# Patient Record
Sex: Female | Born: 1950 | Race: Black or African American | Hispanic: No | Smoking: Former smoker
Health system: Southern US, Community
[De-identification: ages and names within clinical notes are randomized; demographics above are authoritative.]

## PROBLEM LIST (undated history)

## (undated) DIAGNOSIS — N183 Chronic kidney disease, stage 3 unspecified: Secondary | ICD-10-CM

## (undated) DIAGNOSIS — I10 Essential (primary) hypertension: Secondary | ICD-10-CM

## (undated) DIAGNOSIS — E785 Hyperlipidemia, unspecified: Secondary | ICD-10-CM

## (undated) DIAGNOSIS — Z8659 Personal history of other mental and behavioral disorders: Secondary | ICD-10-CM

## (undated) HISTORY — DX: Essential (primary) hypertension: I10

## (undated) HISTORY — DX: Chronic kidney disease, stage 3 (moderate): N18.3

## (undated) HISTORY — DX: Personal history of other mental and behavioral disorders: Z86.59

## (undated) HISTORY — DX: Chronic kidney disease, stage 3 unspecified: N18.30

## (undated) HISTORY — DX: Hyperlipidemia, unspecified: E78.5

---

## 2001-06-15 ENCOUNTER — Inpatient Hospital Stay (HOSPITAL_COMMUNITY): Admission: EM | Admit: 2001-06-15 | Discharge: 2001-06-17 | Payer: Self-pay | Admitting: Emergency Medicine

## 2001-06-15 ENCOUNTER — Encounter: Payer: Self-pay | Admitting: Emergency Medicine

## 2003-06-14 ENCOUNTER — Inpatient Hospital Stay (HOSPITAL_COMMUNITY): Admission: EM | Admit: 2003-06-14 | Discharge: 2003-06-18 | Payer: Self-pay | Admitting: Emergency Medicine

## 2003-06-16 ENCOUNTER — Encounter (INDEPENDENT_AMBULATORY_CARE_PROVIDER_SITE_OTHER): Payer: Self-pay | Admitting: Specialist

## 2003-06-24 ENCOUNTER — Encounter: Admission: RE | Admit: 2003-06-24 | Discharge: 2003-06-24 | Payer: Self-pay | Admitting: Internal Medicine

## 2003-06-25 ENCOUNTER — Encounter: Admission: RE | Admit: 2003-06-25 | Discharge: 2003-06-25 | Payer: Self-pay | Admitting: Internal Medicine

## 2003-07-27 ENCOUNTER — Encounter: Admission: RE | Admit: 2003-07-27 | Discharge: 2003-07-27 | Payer: Self-pay | Admitting: Internal Medicine

## 2003-10-30 DIAGNOSIS — E1122 Type 2 diabetes mellitus with diabetic chronic kidney disease: Secondary | ICD-10-CM | POA: Insufficient documentation

## 2003-10-30 DIAGNOSIS — N183 Chronic kidney disease, stage 3 (moderate): Secondary | ICD-10-CM

## 2003-12-02 ENCOUNTER — Emergency Department (HOSPITAL_COMMUNITY): Admission: EM | Admit: 2003-12-02 | Discharge: 2003-12-02 | Payer: Self-pay | Admitting: Emergency Medicine

## 2004-12-14 ENCOUNTER — Inpatient Hospital Stay (HOSPITAL_COMMUNITY): Admission: EM | Admit: 2004-12-14 | Discharge: 2004-12-17 | Payer: Self-pay | Admitting: Emergency Medicine

## 2005-01-16 ENCOUNTER — Ambulatory Visit: Payer: Self-pay | Admitting: Family Medicine

## 2005-01-18 ENCOUNTER — Ambulatory Visit: Payer: Self-pay | Admitting: *Deleted

## 2005-01-30 ENCOUNTER — Ambulatory Visit: Payer: Self-pay | Admitting: Family Medicine

## 2005-02-13 ENCOUNTER — Ambulatory Visit: Payer: Self-pay | Admitting: Family Medicine

## 2005-02-26 ENCOUNTER — Ambulatory Visit: Payer: Self-pay | Admitting: Family Medicine

## 2005-03-19 ENCOUNTER — Ambulatory Visit: Payer: Self-pay | Admitting: Family Medicine

## 2005-09-03 ENCOUNTER — Ambulatory Visit: Payer: Self-pay | Admitting: Family Medicine

## 2005-09-13 ENCOUNTER — Encounter (HOSPITAL_COMMUNITY): Admission: RE | Admit: 2005-09-13 | Discharge: 2005-12-12 | Payer: Self-pay | Admitting: Family Medicine

## 2005-10-04 ENCOUNTER — Ambulatory Visit: Payer: Self-pay | Admitting: Internal Medicine

## 2006-02-04 ENCOUNTER — Ambulatory Visit: Payer: Self-pay | Admitting: Family Medicine

## 2006-02-20 ENCOUNTER — Ambulatory Visit (HOSPITAL_COMMUNITY): Admission: RE | Admit: 2006-02-20 | Discharge: 2006-02-20 | Payer: Self-pay | Admitting: Family Medicine

## 2006-03-04 ENCOUNTER — Encounter (INDEPENDENT_AMBULATORY_CARE_PROVIDER_SITE_OTHER): Payer: Self-pay | Admitting: *Deleted

## 2006-03-05 ENCOUNTER — Encounter (INDEPENDENT_AMBULATORY_CARE_PROVIDER_SITE_OTHER): Payer: Self-pay | Admitting: Family Medicine

## 2006-03-05 ENCOUNTER — Ambulatory Visit: Payer: Self-pay | Admitting: Family Medicine

## 2006-03-07 LAB — CONVERTED CEMR LAB: Pap Smear: NORMAL

## 2006-04-03 ENCOUNTER — Ambulatory Visit: Payer: Self-pay | Admitting: Family Medicine

## 2006-04-23 ENCOUNTER — Ambulatory Visit (HOSPITAL_COMMUNITY): Admission: RE | Admit: 2006-04-23 | Discharge: 2006-04-23 | Payer: Self-pay | Admitting: Surgery

## 2006-05-08 ENCOUNTER — Ambulatory Visit: Payer: Self-pay | Admitting: Family Medicine

## 2006-05-30 ENCOUNTER — Ambulatory Visit: Payer: Self-pay | Admitting: Family Medicine

## 2006-07-29 LAB — CONVERTED CEMR LAB: Pap Smear: NORMAL

## 2006-08-22 ENCOUNTER — Ambulatory Visit: Payer: Self-pay | Admitting: Family Medicine

## 2006-10-17 ENCOUNTER — Ambulatory Visit: Payer: Self-pay | Admitting: Internal Medicine

## 2006-11-15 ENCOUNTER — Ambulatory Visit: Payer: Self-pay | Admitting: Internal Medicine

## 2006-11-21 ENCOUNTER — Ambulatory Visit (HOSPITAL_COMMUNITY): Admission: RE | Admit: 2006-11-21 | Discharge: 2006-11-21 | Payer: Self-pay | Admitting: Internal Medicine

## 2006-12-24 ENCOUNTER — Ambulatory Visit: Payer: Self-pay | Admitting: Internal Medicine

## 2007-01-28 ENCOUNTER — Ambulatory Visit: Payer: Self-pay | Admitting: Internal Medicine

## 2007-05-06 ENCOUNTER — Encounter (INDEPENDENT_AMBULATORY_CARE_PROVIDER_SITE_OTHER): Payer: Self-pay | Admitting: *Deleted

## 2007-05-06 ENCOUNTER — Ambulatory Visit: Payer: Self-pay | Admitting: Internal Medicine

## 2007-07-16 ENCOUNTER — Encounter (INDEPENDENT_AMBULATORY_CARE_PROVIDER_SITE_OTHER): Payer: Self-pay | Admitting: *Deleted

## 2007-07-31 ENCOUNTER — Encounter (INDEPENDENT_AMBULATORY_CARE_PROVIDER_SITE_OTHER): Payer: Self-pay | Admitting: *Deleted

## 2007-07-31 DIAGNOSIS — I10 Essential (primary) hypertension: Secondary | ICD-10-CM | POA: Insufficient documentation

## 2007-07-31 DIAGNOSIS — J309 Allergic rhinitis, unspecified: Secondary | ICD-10-CM | POA: Insufficient documentation

## 2007-07-31 DIAGNOSIS — Z8639 Personal history of other endocrine, nutritional and metabolic disease: Secondary | ICD-10-CM

## 2007-07-31 DIAGNOSIS — Z862 Personal history of diseases of the blood and blood-forming organs and certain disorders involving the immune mechanism: Secondary | ICD-10-CM | POA: Insufficient documentation

## 2007-07-31 DIAGNOSIS — Z8659 Personal history of other mental and behavioral disorders: Secondary | ICD-10-CM | POA: Insufficient documentation

## 2007-08-05 ENCOUNTER — Ambulatory Visit: Payer: Self-pay | Admitting: Hospitalist

## 2007-08-05 ENCOUNTER — Encounter (INDEPENDENT_AMBULATORY_CARE_PROVIDER_SITE_OTHER): Payer: Self-pay | Admitting: *Deleted

## 2007-08-05 LAB — CONVERTED CEMR LAB
BUN: 15 mg/dL (ref 6–23)
Blood Glucose, Fingerstick: 150
Calcium: 10.7 mg/dL — ABNORMAL HIGH (ref 8.4–10.5)
Chloride: 103 meq/L (ref 96–112)
Creatinine, Ser: 0.88 mg/dL (ref 0.40–1.20)
Glucose, Bld: 94 mg/dL (ref 70–99)
Potassium: 4.4 meq/L (ref 3.5–5.3)
RBC: 4.79 M/uL (ref 3.87–5.11)
RDW: 13.8 % (ref 11.5–14.0)

## 2007-09-29 ENCOUNTER — Ambulatory Visit: Payer: Self-pay | Admitting: Hospitalist

## 2007-09-29 LAB — CONVERTED CEMR LAB: Blood Glucose, Fingerstick: 174

## 2007-10-13 ENCOUNTER — Ambulatory Visit: Payer: Self-pay | Admitting: Infectious Diseases

## 2007-10-13 ENCOUNTER — Encounter (INDEPENDENT_AMBULATORY_CARE_PROVIDER_SITE_OTHER): Payer: Self-pay | Admitting: *Deleted

## 2007-10-13 LAB — CONVERTED CEMR LAB
Calcium: 10.7 mg/dL — ABNORMAL HIGH (ref 8.4–10.5)
Potassium: 4.6 meq/L (ref 3.5–5.3)
Sodium: 140 meq/L (ref 135–145)

## 2008-01-29 ENCOUNTER — Encounter (INDEPENDENT_AMBULATORY_CARE_PROVIDER_SITE_OTHER): Payer: Self-pay | Admitting: *Deleted

## 2008-01-29 ENCOUNTER — Ambulatory Visit: Payer: Self-pay | Admitting: Internal Medicine

## 2008-01-29 LAB — CONVERTED CEMR LAB
BUN: 13 mg/dL (ref 6–23)
Blood Glucose, Fingerstick: 140
Creatinine, Ser: 0.77 mg/dL (ref 0.40–1.20)
Glucose, Bld: 121 mg/dL — ABNORMAL HIGH (ref 70–99)
Potassium: 4.4 meq/L (ref 3.5–5.3)

## 2008-03-04 ENCOUNTER — Telehealth (INDEPENDENT_AMBULATORY_CARE_PROVIDER_SITE_OTHER): Payer: Self-pay | Admitting: *Deleted

## 2008-03-04 ENCOUNTER — Ambulatory Visit: Payer: Self-pay | Admitting: Internal Medicine

## 2008-04-28 ENCOUNTER — Ambulatory Visit: Payer: Self-pay | Admitting: Internal Medicine

## 2008-04-28 LAB — CONVERTED CEMR LAB: Blood Glucose, Fingerstick: 215

## 2008-05-20 ENCOUNTER — Encounter (INDEPENDENT_AMBULATORY_CARE_PROVIDER_SITE_OTHER): Payer: Self-pay | Admitting: *Deleted

## 2008-05-20 ENCOUNTER — Ambulatory Visit: Payer: Self-pay | Admitting: Internal Medicine

## 2008-05-20 LAB — CONVERTED CEMR LAB
Blood Glucose, Fingerstick: 79
Candida species: NEGATIVE
Gardnerella vaginalis: NEGATIVE
Trichomonal Vaginitis: NEGATIVE

## 2008-05-26 ENCOUNTER — Ambulatory Visit (HOSPITAL_COMMUNITY): Admission: RE | Admit: 2008-05-26 | Discharge: 2008-05-26 | Payer: Self-pay | Admitting: *Deleted

## 2008-06-25 ENCOUNTER — Encounter (INDEPENDENT_AMBULATORY_CARE_PROVIDER_SITE_OTHER): Payer: Self-pay | Admitting: Internal Medicine

## 2008-06-25 ENCOUNTER — Ambulatory Visit: Payer: Self-pay | Admitting: *Deleted

## 2008-07-26 ENCOUNTER — Encounter (INDEPENDENT_AMBULATORY_CARE_PROVIDER_SITE_OTHER): Payer: Self-pay | Admitting: *Deleted

## 2008-10-07 ENCOUNTER — Ambulatory Visit: Payer: Self-pay | Admitting: Internal Medicine

## 2008-10-07 LAB — CONVERTED CEMR LAB: Cholesterol, target level: 200 mg/dL

## 2008-10-29 HISTORY — PX: OTHER SURGICAL HISTORY: SHX169

## 2008-12-23 ENCOUNTER — Encounter (INDEPENDENT_AMBULATORY_CARE_PROVIDER_SITE_OTHER): Payer: Self-pay | Admitting: *Deleted

## 2008-12-23 ENCOUNTER — Ambulatory Visit: Payer: Self-pay | Admitting: Internal Medicine

## 2008-12-23 LAB — CONVERTED CEMR LAB
Blood Glucose, Fingerstick: 143
Hgb A1c MFr Bld: 7.1 %

## 2008-12-25 LAB — CONVERTED CEMR LAB
ALT: 16 units/L (ref 0–35)
Alkaline Phosphatase: 90 units/L (ref 39–117)
BUN: 12 mg/dL (ref 6–23)
CO2: 24 meq/L (ref 19–32)
Chloride: 102 meq/L (ref 96–112)
Creatinine, Urine: 78.5 mg/dL
Microalb, Ur: 7.46 mg/dL — ABNORMAL HIGH (ref 0.00–1.89)
Potassium: 4.2 meq/L (ref 3.5–5.3)
Total Bilirubin: 0.1 mg/dL — ABNORMAL LOW (ref 0.3–1.2)
Total Protein: 7.4 g/dL (ref 6.0–8.3)

## 2008-12-31 ENCOUNTER — Ambulatory Visit: Payer: Self-pay | Admitting: Internal Medicine

## 2008-12-31 ENCOUNTER — Encounter (INDEPENDENT_AMBULATORY_CARE_PROVIDER_SITE_OTHER): Payer: Self-pay | Admitting: *Deleted

## 2008-12-31 ENCOUNTER — Telehealth (INDEPENDENT_AMBULATORY_CARE_PROVIDER_SITE_OTHER): Payer: Self-pay | Admitting: Pharmacy Technician

## 2009-01-04 LAB — CONVERTED CEMR LAB
Cholesterol: 140 mg/dL (ref 0–200)
HDL: 40 mg/dL (ref >39)
VLDL: 12 mg/dL (ref 0–40)

## 2009-01-19 ENCOUNTER — Telehealth (INDEPENDENT_AMBULATORY_CARE_PROVIDER_SITE_OTHER): Payer: Self-pay | Admitting: *Deleted

## 2009-03-22 ENCOUNTER — Telehealth (INDEPENDENT_AMBULATORY_CARE_PROVIDER_SITE_OTHER): Payer: Self-pay | Admitting: *Deleted

## 2009-05-03 ENCOUNTER — Telehealth: Payer: Self-pay | Admitting: *Deleted

## 2009-05-11 ENCOUNTER — Encounter: Payer: Self-pay | Admitting: Internal Medicine

## 2009-05-11 ENCOUNTER — Ambulatory Visit: Payer: Self-pay | Admitting: Internal Medicine

## 2009-05-11 ENCOUNTER — Telehealth: Payer: Self-pay | Admitting: Infectious Diseases

## 2009-05-11 LAB — CONVERTED CEMR LAB
CO2: 27 meq/L (ref 19–32)
Creatinine, Ser: 0.8 mg/dL (ref 0.40–1.20)
Glucose, Bld: 148 mg/dL — ABNORMAL HIGH (ref 70–99)
Potassium: 4.5 meq/L (ref 3.5–5.3)
Sodium: 140 meq/L (ref 135–145)

## 2009-07-13 ENCOUNTER — Telehealth: Payer: Self-pay | Admitting: *Deleted

## 2009-08-08 ENCOUNTER — Encounter: Payer: Self-pay | Admitting: Internal Medicine

## 2009-08-15 ENCOUNTER — Encounter: Payer: Self-pay | Admitting: Internal Medicine

## 2009-09-12 ENCOUNTER — Telehealth (INDEPENDENT_AMBULATORY_CARE_PROVIDER_SITE_OTHER): Payer: Self-pay | Admitting: *Deleted

## 2009-09-14 ENCOUNTER — Telehealth: Payer: Self-pay | Admitting: *Deleted

## 2009-09-29 ENCOUNTER — Ambulatory Visit: Payer: Self-pay | Admitting: Internal Medicine

## 2009-12-06 ENCOUNTER — Telehealth (INDEPENDENT_AMBULATORY_CARE_PROVIDER_SITE_OTHER): Payer: Self-pay | Admitting: *Deleted

## 2010-04-12 ENCOUNTER — Encounter: Payer: Self-pay | Admitting: Internal Medicine

## 2010-05-02 ENCOUNTER — Ambulatory Visit: Payer: Self-pay | Admitting: Internal Medicine

## 2010-05-02 LAB — CONVERTED CEMR LAB: Blood Glucose, Fingerstick: 243

## 2010-05-03 ENCOUNTER — Encounter: Payer: Self-pay | Admitting: Internal Medicine

## 2010-05-03 ENCOUNTER — Ambulatory Visit: Payer: Self-pay | Admitting: Internal Medicine

## 2010-05-03 LAB — CONVERTED CEMR LAB
ALT: 23 units/L (ref 0–35)
Albumin: 4.7 g/dL (ref 3.5–5.2)
Calcium: 10.1 mg/dL (ref 8.4–10.5)
Chloride: 104 meq/L (ref 96–112)
Glucose, Urine, Semiquant: NEGATIVE
HDL: 51 mg/dL (ref >39)
Ketones, urine, test strip: NEGATIVE
LDL Cholesterol: 95 mg/dL (ref 0–99)
Microalb Creat Ratio: 48.2 mg/g — ABNORMAL HIGH (ref 0.0–30.0)
Microalb, Ur: 1.72 mg/dL (ref 0.00–1.89)
Potassium: 4.3 meq/L (ref 3.5–5.3)
Sodium: 142 meq/L (ref 135–145)
Specific Gravity, Urine: 1.025
Total Bilirubin: 0.2 mg/dL — ABNORMAL LOW (ref 0.3–1.2)
Total CHOL/HDL Ratio: 3.4
Total Protein: 7.4 g/dL (ref 6.0–8.3)
Triglycerides: 127 mg/dL (ref <150)
VLDL: 25 mg/dL (ref 0–40)

## 2010-06-06 LAB — HM MAMMOGRAPHY: HM Mammogram: NEGATIVE

## 2010-06-16 ENCOUNTER — Ambulatory Visit (HOSPITAL_COMMUNITY): Admission: RE | Admit: 2010-06-16 | Discharge: 2010-06-16 | Payer: Self-pay | Admitting: Internal Medicine

## 2010-08-31 ENCOUNTER — Telehealth: Payer: Self-pay | Admitting: *Deleted

## 2010-09-05 ENCOUNTER — Telehealth: Payer: Self-pay | Admitting: *Deleted

## 2010-09-13 ENCOUNTER — Encounter: Payer: Self-pay | Admitting: Internal Medicine

## 2010-10-06 ENCOUNTER — Ambulatory Visit: Payer: Self-pay | Admitting: Internal Medicine

## 2010-10-06 LAB — HM DIABETES FOOT EXAM

## 2010-11-19 ENCOUNTER — Encounter: Payer: Self-pay | Admitting: Family Medicine

## 2010-11-19 ENCOUNTER — Encounter: Payer: Self-pay | Admitting: Surgery

## 2010-11-28 NOTE — Miscellaneous (Signed)
Summary: [facility]   [facility]   Imported By: Gentry Fitz 04/25/2010 11:39:54  _____________________________________________________________________  External Attachment:    Type:   Image     Comment:   External Document
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
## 2010-11-28 NOTE — Progress Notes (Signed)
Summary: refill/gg  Phone Note Refill Request  on December 06, 2009 2:13 PM  Refills Requested: Medication #1:  LANTUS 100 UNIT/ML  SOLN Take 26 units subcutaneously each night  Method Requested: Electronic Initial call taken by: Merrie Roof RN,  December 06, 2009 2:14 PM    Prescriptions: LANTUS 100 UNIT/ML  SOLN (INSULIN GLARGINE) Take 26 units subcutaneously each night  #4 vials x 11   Entered and Authorized by:   Zoila Shutter MD   Signed by:   Zoila Shutter MD on 12/06/2009   Method used:   Electronically to        [facility] [phone]* (retail)       [address]       [place], [place]  [address]       Ph: [phone]       Fax: [phone]   RxID:   [id number]

## 2010-11-28 NOTE — Progress Notes (Signed)
  Phone Note Outgoing Call   Call placed by: Theotis Barrio NT II,  September 05, 2010 3:51 PM Call placed to: Patient Details for Reason: [facility] Summary of Call: SPOKE WITH PATIENT BY PHONE / GAVE HER APPT/ [facility] / NOV. 16, 011 @ 10:10AM

## 2010-11-28 NOTE — Progress Notes (Signed)
  Phone Note Outgoing Call   Call placed by: Theotis Barrio NT II,  August 31, 2010 10:00 AM Call placed to: Patient Details for Reason: [facility] Summary of Call: SPOKE WITH MS Hale, EYE APPT IS FOR NOV 16, 011 @ 10:00AM. REMINDER LETTER ALSO WAS MAILED TO THE PATIENT.
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
## 2010-11-30 NOTE — Consult Note (Signed)
Summary: Stephanie Davis  Stephanie Davis   Imported By: Shon Hough 10/17/2010 15:28:33  _____________________________________________________________________  External Attachment:    Type:   Image     Comment:   External Document  Appended Document: [name]   Diabetic Eye Exam  Procedure date:  09/13/2010  Findings:      Mild non-proliferative diabetic retinopathy.  OU   Procedures Next Due Date:    Diabetic Eye Exam: 09/2011   Diabetic Eye Exam  Procedure date:  09/13/2010  Findings:      Mild non-proliferative diabetic retinopathy.  OU   Procedures Next Due Date:    Diabetic Eye Exam: 09/2011

## 2011-01-14 LAB — GLUCOSE, CAPILLARY: Glucose-Capillary: 243 mg/dL — ABNORMAL HIGH (ref 70–99)

## 2011-02-13 LAB — GLUCOSE, CAPILLARY: Glucose-Capillary: 143 mg/dL — ABNORMAL HIGH (ref 70–99)

## 2011-02-22 ENCOUNTER — Encounter: Payer: Self-pay | Admitting: Internal Medicine

## 2011-03-16 NOTE — Op Note (Signed)
NAME:  Stephanie Davis, Stephanie Davis                        ACCOUNT NO.:  [id number]   MEDICAL RECORD NO.:  [id number]                   PATIENT TYPE:  INP   LOCATION:  3304                                 FACILITY:  [facility]   PHYSICIAN:  Petra Kuba, M.D.                 DATE OF BIRTH:  12/25/1950   DATE OF PROCEDURE:  06/16/2003  DATE OF DISCHARGE:                                 OPERATIVE REPORT   PROCEDURE PERFORMED:  Colonoscopy with biopsy.   ENDOSCOPIST:  Petra Kuba, M.D.   INDICATIONS FOR PROCEDURE:  Patient with bright red blood per rectum,  chronic anemia, diarrhea.  Consent was signed after the risks, benefits,  methods and options were thoroughly discussed with the both patient and her  daughter.   MEDICINES USED:  Demerol 50 mg, Versed 5 mg.   DESCRIPTION OF PROCEDURE:  Rectal inspection was pertinent for external  hemorrhoids, small.  Digital exam was negative.  A video pediatric  adjustable colonoscope was inserted and with abdominal pressure and rolling  her on her back, easily advanced to the cecum.  There might have been some  minimal erythema in the colon, but no obvious abnormality and no signs of  bleeding.  The cecum was identified by the appendiceal orifice and the  ileocecal valve.  In fact the scope was inserted a short ways into the  terminal ileum.  Photodocumentation and random biopsies were obtained and  put in the first containers.  The scope was slowly withdrawn.  The prep was  adequate.  There was some liquid stool that required washing and suctioning.  On slow withdrawal through the colon, some random biopsies were obtained.  Other than some very minimal erythema mentioned above and some rare left-  sided diverticula, no other abnormalities were seen as we slowly withdrew  back to the rectum.  Anorectal pull-through and retroflexion confirmed some  hemorrhoids with some tears but no other abnormality.  Scope was  straightened, readvanced a short ways up  the left side of the colon, air was  suctioned, scope removed.  The patient tolerated the procedure well.  There  was no immediate obvious complication.   ENDOSCOPIC DIAGNOSIS:  1. Internal and external hemorrhoids with tears causing bright red blood per     rectum.  2. Occasional early, left-sided diverticula.  3. Very minimal colon erythema, status post biopsy.  4. Otherwise within normal limits to the terminal ileum, status post random     biopsies to rule out microscopic colitis.    PLAN:  Hemorrhoidal therapy with suppositories with HC twice daily.  Slowly  advance diet.  Await pathology.  Probably can go home either tonight or  tomorrow unless other medical issues.  Happy to see back p.r.n.  However, if  biopsy is nondiagnostic and diarrhea  continues, will proceed with an upper  GI, small bowel follow-through next, repeat screening in five to  10 years  unless needed sooner p.r.n.                                               Petra Kuba, M.D.    MEM/MEDQ  D:  06/16/2003  T:  06/17/2003  Job:  [id number]   cc:   Ileana Roup, M.D.  [address], [place] [address]  Fax: [phone]

## 2011-03-16 NOTE — Discharge Summary (Signed)
NAMEFLORIDA, NOLTON              ACCOUNT NO.:  [id number]   MEDICAL RECORD NO.:  [id number]          PATIENT TYPE:  INP   LOCATION:  3008                         FACILITY:  [facility]   PHYSICIAN:  Corinna L. Lendell Caprice, MDDATE OF BIRTH:  Aug 15, 1951   DATE OF ADMISSION:  12/14/2004  DATE OF DISCHARGE:  12/17/2004                                 DISCHARGE SUMMARY   DIAGNOSES:  1.  Diabetic ketoacidosis.  2.  New onset diabetes suspect type 1.  3.  Resolved leukocytosis and bandemia.  4.  Anemia with history of thalassemia.  5.  Hypernatremia.  6.  Hypokalemia.  7.  Increased pancreatic enzymes.  8.  Resolved acute renal insufficiency.  9.  Dehydration.  10. History of depression.  11. History of pancreatitis.   DISCHARGE MEDICATIONS:  Lantus insulin 24 units subcutaneously q.h.s. She  will most likely need Humalog with meals but this can be determined at her  follow-up appointment.   She may return to work in 4 days.   DIET:  Diet should be diabetic. She is to monitor her blood glucoses before  meals, record them and bring of the values with [facility]  her follow-up appointment. She is to follow up within 2 weeks.   CONDITION:  Stable.   CONSULTATIONS:  None.   PROCEDURES:  None   PERTINENT LABORATORY DATA:  Hemoglobin A1C is 16.7. Blood cultures negative.  UA was negative for nitrites or leukocyte esterase, it had greater than 80  ketones, 100 protein, greater than 1000 glucose, trace hemoglobin, intact  PTH was normal. TSH 2.690, CPK-MB and troponin normal. Serum acetone after  the patient had been treated with insulin and IV fluids was small.  Initial  sodium was 146, potassium 3.6, chloride 120, bicarbonate 9, glucose 658, BUN  16, creatinine 1.7 and calcium 10.8, albumin 2.9, total bilirubin 2.0, AST  90, ALT 9, alkaline phosphatase 89, phosphorus  5.4, amylase 104, lipase  130. Lactic acid 0.9. Her potassium dropped to low 2.5. Her sodium increased  to  158.  At discharge her creatinine was 0.7 her sodium was 150, potassium  3.8.  CBC on admission was significant for a white blood cell count of  25,000 with increased bands greater than 20%; the rest of her CBC was fairly  unremarkable.  With hydration her hemoglobin dropped to 8.8 but she had no  signs of bleeding.  At discharge her hemoglobin was about 9.5.  Initial pH  was 6.873, pCO2 was 24, pO2 was 112, bicarbonate was 5.  Repeat blood gas  with treatment showed a pH of 7.3, pCO2 of 20, pO2 of 129.   SPECIAL STUDIES AND RADIOLOGY:  Chest x-ray showed low lung volumes.  CT of  the face showed no abscess.   HISTORY AND HOSPITAL COURSE:  Ms. Crotteau is a pleasant 60 year old black  female with a history of previous alcohol abuse and pancreatitis who  presented to the emergency room with weakness, polyuria, polydipsia, blurred  vision.  She had no history of diabetes and has no primary care physician.  She has not drank  over a year.  On physical examination, she was drowsy.  She had a pulse of 120, respiratory rate 28, blood pressure was 128/90,  temperature was normal. She had dry mucous membranes. Poor dentition.  Is  thin and had some right basilar rales. Her laboratory and history were  consistent with diabetic ketoacidosis,  Due to her body habitus and  acidosis, the presumption is that the patient is a type 1 diabetic. She was  started on an insulin drip, vigorous IV fluids and admitted to the step-down  unit.  Due to the leukocytosis, she was started empirically on Zosyn. She  had no evidence of infection, fevers or symptoms and the Zosyn was  eventually stopped.  Her white count normalized. Her acidosis cleared and  her blood sugars at the time of discharge ranged from 100 to just over 200.  She was ambulating fine and feeling much better. The patient initially had  gotten a lot of  saline and she developed hypernatremia.  Her IV fluids were  adjusted and this improved. She also  developed severe hypokalemia. This was  repleted adequately.  The patient was given diabetic teaching and at time of  discharge is able to inject her on insulin and check her blood sugars.  For  now, I have elected to place the patient on Lantus alone but I have told her  that she will most likely benefit from a four times a day injections;  however, at this time she may find it less overwhelming with once a day  injections until her follow-up appointment.  I would, however, recommend  four times a day intensive insulin regimen to be arranged at her follow-up  appointment. I have told her that she needs an eye exam yearly as a diabetic  and recommend that she quit smoking.      CLS/MEDQ  D:  12/17/2004  T:  12/18/2004  Job:  [id number]   cc:   [facility]

## 2011-03-16 NOTE — Consult Note (Signed)
NAME:  Stephanie Davis, CORRIE NO.:  [id number]   MEDICAL RECORD NO.:  [id number]                   PATIENT TYPE:  EMS   LOCATION:  MAJO                                 FACILITY:  [facility]   PHYSICIAN:  Petra Kuba, M.D.                 DATE OF BIRTH:  05-04-1951   DATE OF CONSULTATION:  06/14/2003  DATE OF DISCHARGE:                                   CONSULTATION   HISTORY:  The patient is seen at the request of the medical teaching service  for some bloody diarrhea. She says she has seen blood for about the past  week, has been mostly on the toilet tissue, rarely in the water. She had had  diarrhea about three times a day for the last month. She has been loosing  weight over the last year and using increased doses of alcohol, mostly  because of depression. She says both her daughter and her mother had died  recently. She had been told she has been anemic in the past but has not had  a blood count in the last three years. She did have an episode back then of  probable pancreatitis, does not remember what the cause was. She had not had  any previous GI workup or other tests.   PAST MEDICAL HISTORY:  Pertinent for the history of pancreatitis and  probably anemia. She does have the alcohol abuse.   ALLERGIES:  She has had no known allergies.   FAMILY HISTORY:  Negative for any diarrheal illness, colon cancer, colon  polyps, or any ulcers.   MEDICATIONS:  Include an over-the-counter sleep aid only. She does not use  any aspirin or nonsteroidals.   SOCIAL HISTORY:  Does drink almost every day. Does smoke. Minimizes over-the-  counter aspirin and nonsteroidals as above.   REVIEW OF SYSTEMS:  Pertinent for some occasional nausea and vomiting  without any blood and no black tarry stools.   PHYSICAL EXAMINATION:  VITAL SIGNS:  See chart.  GENERAL:  No acute distress. Lying comfortably in the bed.  HEENT:  Sclerae are nonicteric.  NECK:  Supple without  obvious adenopathy.  LUNGS:  Clear.  HEART:  Regular rate and rhythm.  ABDOMEN:  Soft, nontender. She was guaiac positive by the team.   LABORATORY DATA:  Pertinent for hemoglobin 9.6 with a MCV of 73, normal  white count and platelet count. Her alcohol level is 47. Lipase normal.  Urine pregnancy normal. PT 14.4, PTT 35. BUN and creatinine normal. SGOT  157, SGPT 104, alkaline phosphatase 141, albumin 2.4, bilirubin 0.9.   ASSESSMENT:  1. Anemia and bright red blood per rectum.  2. Diarrhea.  3. Depression, alcohol abuse, and weight loss.  4. History of pancreatitis, probably alcohol abuse.  5. History of thalassemia.  6. Abnormal liver tests, probably due to alcohol.   PLAN:  Clear liquid diet is okay. I think  she is stable enough to decrease  her Protonix to just p.o. Go ahead and proceed with stool studies in the  meantime for her diarrheal workup, and if no signs of withdrawal, probably  proceed with a colonoscopy on Wednesday. Certainly would proceed if signs of  active bleeding. The risks, benefits, and methods of colonoscopy were  discussed. Possibly, we can get the old chart and see what her hemoglobin  and MCV were then. Thank you very much for consultation. We will follow with  you.                                               Petra Kuba, M.D.    MEM/MEDQ  D:  06/14/2003  T:  06/15/2003  Job:  [id number]   cc:   Ileana Roup, M.D.  [address], [place] [address]  Fax: [phone]

## 2011-03-16 NOTE — Discharge Summary (Signed)
NAME:  Stephanie Davis, Stephanie Davis                        ACCOUNT NO.:  [id number]   MEDICAL RECORD NO.:  [id number]                   PATIENT TYPE:  OUT   LOCATION:  EKG                                  FACILITY:  [facility]   PHYSICIAN:  Ileana Roup, M.D.               DATE OF BIRTH:  [birth year]   DATE OF ADMISSION:  DATE OF DISCHARGE:  06/18/2003                                 DISCHARGE SUMMARY   DISCHARGE DIAGNOSES:  1. History of bright red blood per rectum with endoscopic diagnoses or     external hemorrhoids with tears likely causing bright red blood per     rectum.  2. History of alcoholism.  3. Depression.   REASON FOR ADMISSION:  The patient reported to the emergency department on  June 14, 2003, with a three-day history of bright red blood per rectum,  loose stools and history of diarrhea x3 weeks.  He reported a history of  about three stools per day.  The patient reported that she had stopped in  order to avoid having diarrhea.  The patient also reported a history of  vomiting two to three days prior to admission.  Yesterday on the day prior  to admission, she did have vomiting, some blood streaked.   CONSULTATIONS:  1. Dr. Petra Kuba, gastroenterology.  2. Dr. Jeanie Sewer, psychiatry.   STUDIES:  1. Colonoscopy was performed which showed internal and external hemorrhoids     with tags causing bright red blood per rectum, on June 16, 2003.  2. Occasional early left-sided diverticula.  3. Very-minimal colon erythema status post biopsy.  4. Otherwise within normal limits to the terminal ileum, status post random     biopsies to rule out microscopic colitis.   ADMISSION LABORATORY DATA:  Sodium 137, potassium 3.1, chloride 102, CO2 27,  BUN 5, creatinine 0.5.  Glucose 90.  Anion gap 8.  Total bilirubin 0.9,  alkaline phosphatase 1.1.  AST, ALT 157.  SGPT 104.  Protein 6.3.  Albumin  2.4.  Calcium 10.5.  White blood cell count 6.2.  Hemoglobin 9.6, hematocrit  30.2.   Platelets 324,000.  ETOH level was greater than 47.   HOSPITAL COURSE:  Problem #1.  Bright red blood from rectum.  A GI  consultation was obtained.  The patient was typed and screen for four units  of packed red blood cells.  Initially, she was placed in the stepdown unit  with vitals q.1 hour x4, and q.2 hours x2, and then q.4 hours.  Serial  hemoglobin and hematocrit were obtained q.6h. for 24 hours, and then q.8  hours.  The patient was found fecal occult positive during this admission.  She was also found to have a serum ferritin of 20 with iron of 11, total  iron binding capacity 313, and the percent saturation less than 4.  Hemoglobin on August 17 dropped to 7.7.  On August 18, a  colonoscopy was  performed to try to determine the etiology of her bright red blood per  rectum.  Prior to the study, the patient was transfused with two units of  packed red blood cells.  Colonoscopy results were as previously mentioned.  The patient was treated for hemorrhoids with suppositories with  hydrocortisone b.i.d.  Upon discharge, the patient was stable and tolerating  p.o. intake well without incident.   The patient also had a small bowel biopsy to rule out colitis.  The results  were as follows:  Benign small bowel mucosa, no villous atrophy,  inflammation or other abnormalities present.  Minimal active colitis.  Section showed benign colonic mucosa with patchy minimal active colitis in  the form of neutrophilic cells and crypt epithelium.  This is particularly  seen in the deeper aspects of the mucosa.  Given the minimal involvement,  the appearance is nonspecific, and the differential diagnoses includes  Crohn's disease, infection, NSAID's as well as changes related to  diverticulitis.  Clinical and endoscopic correlation is recommended.  The  patient was discharged on Protonix 40 mg p.o. daily.  She was also  instructed to followup with Dr. Petra Kuba if she continued to have GI   complaints after discharge.  She was instructed to abstain from drinking  alcohol at the time of discharge.   Problem #2.  Alcoholism.  The patient was placed on withdrawal precautions.  She was given thiamine and Folic acid, and Protonix.  Hepatitis serologies  were obtained.  The patient stated that her alcoholism was likely secondary  to depression due to the recent death of her child in the past year, and  also the death of her mother.  She was referred to [facility] for help obtaining counseling for her grief/ depression.  Also for  her alcoholism, she was also referred to AA, and she was also referred to  ABS.   Problem #3.  Depression.  The patient initially reported some suicidal  ideations.  However, she did Agricultural engineer.  She also reported some  homicidal ideation regarding her husband.  However, she stated it was okay  for him to visit her during her stay.  She stated that their relationship  had become more difficult after the loss of her child.  The patient was  started on Lexapro 5 grams q.a.m. x2 days and then increased to 10 mg q.a.m.   DISCHARGE CONDITION:  She was discharged to home in stable condition.   DISCHARGE MEDICATIONS:  1. The patient was discharged on Protonix 40 mg p.o. daily.  2. Lexapro 10 mg p.o. daily.   DISCHARGE INSTRUCTIONS:  The patient was instructed to follow up with Dr.  Donald Pore on Thursday, August 26, at 3 p.m. n the [facility] outpatient  clinic.      Donald Pore, MD                          Ileana Roup, M.D.    HP/MEDQ  D:  10/27/2003  T:  10/28/2003  Job:  [id number]

## 2011-03-16 NOTE — Discharge Summary (Signed)
[place]. [facility]  Patient:    VANICE, RAPPA Visit Number: [id number] MRN: [id number]          Service Type: MED Location: [phone] Attending Physician:  Phifer, Trinna Post Dictated by:   Vear Clock, M.D. Adm. Date:  06/15/2001 Disc. Date: 06/17/2001                             Discharge Summary  DATE OF BIRTH:  1951-06-21.  CHIEF COMPLAINT:  Abdominal pain and decreased p.o. intake.  DISCHARGE DIAGNOSES: 1. Pancreatitis. 2. Hypokalemia. 3. Anemia. 4. Alcoholism.  DISCHARGE MEDICATIONS: 1. Multivitamin 1 p.o. q. day. 2. Fergon 325 mg p.o. t.i.d.  FOLLOWUP:  The patient is to call [facility] at [phone] to arrange a followup appointment within 1 month with Dr. Ophelia Charter.  PROCEDURES AND DIAGNOSTIC STUDIES:  Abdominal ultrasound on June 15, 2001, shows normal abdominal ultrasound with no thickening of the gallbladder wall or sludge present.  Pancreas is normal.  ADMISSION HISTORY AND PHYSICAL:  This is a 60 year old black female with no significant medical problems who presents with abdominal pain, decreased p.o. intake, nausea and vomiting today.  Her husband had given the patient Epsom salt and baking soda.  The patient became hot, weak and tire and has had no previous episodes.  She presented to the ED at approximately 1800 hours to get checked out.  The patient does have a history of alcoholism and drinks about 1/5th of alcohol per day but is able to work.  She says "I dont have a problem with alcohol except I cant afford it much", that is the only problem.  LABORATORY DATA:  Lipase and amylase on admission:  Lipase 169, amylase 221, liver enzymes ALT 41, AST 25, alcohol was less than 10.  CBC on admission: White count 12.9, hemoglobin 8.9, platelets 276.  Chemistries on admission: Sodium 134, potassium 3.0, chloride 96, CO2 32, BUN 6, creatinine 0.8, glucose 990.  Calcium 9.7, total protein 5.4,  albumin 2.3, total bilirubin 1.0, and alk. phos. 15.  ASSESSMENT:  This is a 60 year old female with acute pancreatitis.  PLAN: #1 - PANCREATITIS THOUGHT SECONDARY TO ALCOHOL ABUSE:  The patient denies interest in or need for rehabilitation.  Will check magnesium level, likely likely decrease K-Dur 20 mEq.  Due to decreased magnesium will replete K as indicated.  Pain control with IV morphine.  Check urine drug screen and UA. Will check triglycerides, lipase and amylase increased but not alarmingly.  #2 - ANEMIA:  Better though MPV 69, likely microcytic due to thalassemia. Will check iron studies, hemoglobin electrophoresis.  Also Guaiac stools.  #3 - ______ total protein and albumin likely nutritional status issue given impressive alcohol use.  HOSPITAL COURSE: #1 - PANCREATITIS: Resolved during the patients hospital course.  She was able to tolerate clear liquids on June 16, 2001, and tolerated a full breakfast on June 17, 2001, before discharge.  No further nausea, vomiting or decrease p.o. intake.  Given that her triglycerides were within normal limits and other causes for pancreatitis were primarily eliminated the likely cause is secondary to alcoholism.  #2 - HYPOKALEMIA:  The patient was repleted as indicated and resolved her hypokalemia prior to discharge.  Magnesium level was within normal limits.  #3 - ANEMIA:  Iron studies show an iron deficiency anemia possibly in conjunction with the beta-thalasemia picture.  The patient does have a history  of anemia in the past so likely beta-thalasemia is a good consideration.  The patient had no stools or guaiac.  #4 - ALCOHOLISM:  The patient states that she is just going to stop drinking and has no desire for treatment at this time.  Is given the telephone numbers for both ADS and Alcoholics Anonymous prior to discharge.  DISCHARGE LABORATORY DATA:  CBC on June 17, 2001, hemoglobin 7.5, WBC 6.9, platelets 230, MCV 62.4,  chem-7 on day of discharge sodium 137, potassium 4.0, chloride 109, TSH ______27, BUN 5, creatinine 0.7, glucose 129.  Liver enzymes on day of discharge:  AST 23, ALT 18, alk. phos. 62, total bilirubin 0.7, calcium 9.7.  Patient had a negative alcohol level.  Ferritin on June 15, 2001, was 21.  Iron on June 16, 2001, was 13, TIBC 282, and percent saturation 5.  Triglycerides were 47.  Folate 3.4.  There were no drugs of abuse detected.  The patient was discharged to home without incident and encouraged to followup with Dr. Ophelia Charter in the clinic. Dictated by:   Vear Clock, M.D. Attending Physician:  Phifer, Trinna Post DD:  06/17/01 TD:  06/17/01 Job: [id number]

## 2011-03-16 NOTE — H&P (Signed)
Stephanie Davis, Stephanie Davis              ACCOUNT NO.:  [id number]   MEDICAL RECORD NO.:  [id number]          PATIENT TYPE:  INP   LOCATION:  1843                         FACILITY:  [facility]   PHYSICIAN:  Corinna L. Lendell Caprice, MDDATE OF BIRTH:  1951-08-27   DATE OF ADMISSION:  12/14/2004  DATE OF DISCHARGE:                                HISTORY & PHYSICAL   Patient has no primary care doctor.   CHIEF COMPLAINT:  Not feeling well and found to be in DKA.   Patient is a 60 year old African-American female with past medical history  of alcoholism, reportedly sober for about a year as well as a history of  thalassemia who presents with several days of not feeling well and found to  be in DKA.  By review of patient's previous history and discharge summaries  and in discussion with the patient and her husband she has been going  downhill since approximately four years ago with the sudden death of her  daughter.  At that time she has been suffering from depression and turned to  alcohol.  She has had a few admissions for bleeding of bright red blood per  rectum.  At that time patient was noted to have elevated alcohol levels as  well as a previous history of pancreatitis.  Patient appeared drowsy and was  able to give a limited history which her husband was able to fill in the  details but over the past few days he had noted that she has been feeling  more and more weak, lethargic, and that her teeth have greatly decayed.  He  had noted that previously she had excellent teeth but over the past month  they have greatly decayed.  In addition, she had been losing a lot of  weight.  However, she has continued to drink a lot of water and urinating a  lot.  Today, after several days he brought her into the emergency room after  he notes that she was acting confused, slightly drowsy, but also not making  sense, talking about her granddaughter attending a different school and  coming to visit when she  normally lived there.  Patient was brought into the  emergency room.  Here in the emergency room she was noted to have marked  laboratory abnormalities including a blood sugar of 627.  At this time I-  stat laboratories were checked and patient was noted to have a markedly low  bicarbonate of 4.5 as well as a white count of 25.7 with significant 84%  shift and greater than 20% bandemia.  Other laboratories of note and concern  were a creatinine of 1.7 and an anion gap of 22.  Patient was immediately  started on the DKA protocol including wide open IV fluids and insulin  Glucommander drip.  Currently, she states that she denies any pain, she just  feels very tired.   REVIEW OF SYSTEMS:  Patient is limited but she denies any headache.  She  does report some blurry vision.  No dysphagia.  No chest pain, palpitations,  shortness of breath, wheeze, cough, although she breathes  her breathing is  labored, though.  She denies any abdominal pain, hematuria, dysuria.  She  does complain of polyuria and her husband does note increased thirst as  well.  No constipation or diarrhea.  No focal extremity weakness or  numbness, although overall she feels quite fatigued.  Review of systems  otherwise negative.   PAST MEDICAL HISTORY:  1.  Pancreatitis.  2.  Alcoholism, reportedly sober x1 year.  3.  Tobacco abuse.  4.  Rectal bleeding worked up secondary to hemorrhoids.  5.  History of thalassemia.  6.  History of hypercalcemia not worked up.  7.  Depression.   MEDICATIONS:  None, although according to a previous discharge summary  patient was supposed to be on a multivitamin, thiamine, and folate.   ALLERGIES:  None.   SOCIAL HISTORY:  Patient smokes a pack of cigarettes a week, ongoing.  She  used to drink very heavily, but has quit approximately a year ago according  to her and her husband.  She denies any drug use.   FAMILY HISTORY:  Negative for diabetes.   PHYSICAL EXAMINATION:  GENERAL:   She is slightly drowsy, but arousable and  she appears to be alert and oriented x2.  She appears to be on no apparent  acute distress, although she is breathing slightly hard.  HEENT:  Normocephalic, atraumatic.  She has very poor dentition which again  husband says is a big change for her.  Her mucous membranes are quite dry.  NECK:  She has no carotid bruits.  HEART:  Regular rate and rhythm.  S1, S2.  LUNGS:  Clear except for some very fine right basilar crackles.  ABDOMEN:  Soft, nontender, nondistended.  Positive bowel sounds.  EXTREMITIES:  No clubbing, cyanosis, or edema.  I cannot see any evidence of  cellulitis anywhere.   LABORATORIES:  Patient's chest x-ray is unremarkable, although is noted to  have low volume.  White count 25.7 with an H&H 11.9/39.5, MCV low at 65,  platelet count of 570.  Her urinalysis shows greater than 1000 glucose,  greater than 80 ketones, and total protein of 100.  It also has few bacteria  and only 0-2 red cells.  Phosphorous level is pending.  She is also noted on  differential to have some burr cells and 84% shift and greater than 20%  bandemia.  Sodium is 146, potassium 3.6, chloride 120, bicarbonate 4.6, BUN  16, glucose 658.  pH 6.87, pCO2 26.  Her acid base deficit is markedly  elevated at 29.  In response to this the ER attending gave the patient 2  amps of bicarbonate.  A follow-up BMET approximately a half hour later gives  a sodium 139, potassium 3.3, chloride 109, bicarbonate 9, BUN 15, creatinine  1.7, glucose 627, an elevated calcium of 10.8 which on previous review of  discharge summaries in the past patient has always had a very  high calcium.  However, anion gap now is down to 19.   ASSESSMENT/PLAN:  1.  Diabetic ketoacidosis.  Patient is markedly acidotic.  She is showing      signs of improvement, however, with her anion gap slightly decreasing.     I agree with continuing intravenous fluids and Glucommander protocol for       diabetic ketoacidosis.  Need to be wary with her intravenous fluids wide      open in an elderly patient to not volume overload her.  Will follow her  bicarbonate and pH and if need to will give addition of bicarbonate      supplementation.  I note that it is unusual for patients this age to get      new onset diabetic ketoacidosis and type 1 diabetes.  She could have      previous problems with autoimmune disease.  I will go ahead and check a      TSH.  But more likely, she used to be a heavy drinker and probably had a      previous history of pancreatitis.  She supposedly quit less than a year      ago.  Like her pancreatitis is burnt out and she is not having any      insulin production so she virtually has type 1 diabetes.  Once she is      p.o. will need to add Pancrease enzymes to her laboratories.  2.  Infection.  White count of 25 and greater than 20% bandemia.  I do not      know her source as her urine and chest x-ray appear to be unremarkable.      The husband reports the patient's teeth normally perfect have decayed as      of late.  Go ahead and check blood cultures and a dental CT to rule out      abscess.  Start her on intravenous Zosyn.  Likely the infection was a      precipitating event for diabetic ketoacidosis.  3.  Low MCV.  Reports of thalassemia in the old summary and would expect      patient to be macrocytic, not microcytic given her previous history of      alcohol abuse.  Will go ahead and add intravenous thiamine, folate, and      multivitamin.  4.  Hypercalcemia.  Patient with a previous history, but no work-up.  Will      go ahead and check an intact PTH.  5.  Renal insufficiency which should improve with intravenous fluids.  6.  Depression.  Once she is stable could consider adding Lexapro.      SKK/MEDQ  D:  12/14/2004  T:  12/14/2004  Job:  [id number]

## 2011-04-24 ENCOUNTER — Other Ambulatory Visit: Payer: Self-pay | Admitting: *Deleted

## 2011-04-24 MED ORDER — HYDROCHLOROTHIAZIDE 25 MG PO TABS: 25.0000 mg | ORAL_TABLET | ORAL | Status: DC

## 2011-04-24 NOTE — Telephone Encounter (Signed)
No labs for a year.  Must keep July app't.

## 2011-04-24 NOTE — Telephone Encounter (Signed)
Pt was called and informed to keep her appt.

## 2011-05-08 ENCOUNTER — Ambulatory Visit (INDEPENDENT_AMBULATORY_CARE_PROVIDER_SITE_OTHER): Payer: Self-pay | Admitting: Internal Medicine

## 2011-05-08 ENCOUNTER — Encounter: Payer: Self-pay | Admitting: Internal Medicine

## 2011-05-08 VITALS — BP 117/79 | HR 70 | Temp 98.7°F | Ht 64.0 in | Wt 154.7 lb

## 2011-05-08 DIAGNOSIS — E119 Type 2 diabetes mellitus without complications: Secondary | ICD-10-CM

## 2011-05-08 DIAGNOSIS — I10 Essential (primary) hypertension: Secondary | ICD-10-CM

## 2011-05-08 LAB — POCT GLYCOSYLATED HEMOGLOBIN (HGB A1C): Hemoglobin A1C: 7.2

## 2011-05-08 LAB — LIPID PANEL
LDL Cholesterol: 90 mg/dL (ref 0–99)
Total CHOL/HDL Ratio: 3.9 Ratio
VLDL: 30 mg/dL (ref 0–40)

## 2011-05-08 MED ORDER — HYDROCHLOROTHIAZIDE 25 MG PO TABS: 25.0000 mg | ORAL_TABLET | ORAL | Status: DC

## 2011-05-08 NOTE — Patient Instructions (Signed)
Please, Call with any questions. Please, do not forget to make an appointment for a mammogram in August of 2012 and a pap smear. I will call you with your lab results. You are doing great!!! Please, do not forget to show your card to Ms. Victory Dakin for a price! I will see you in 6 months or sooner.

## 2011-05-09 LAB — MICROALBUMIN / CREATININE URINE RATIO: Microalb Creat Ratio: 13.9 mg/g (ref 0.0–30.0)

## 2011-05-16 ENCOUNTER — Encounter: Payer: Self-pay | Admitting: Internal Medicine

## 2011-05-16 NOTE — Progress Notes (Signed)
  Subjective:    Patient ID: Stephanie Davis, female    DOB: 1950-12-03, 60 y.o.   MRN: [id number]  HPI  1. Follow up on DM. Patient denies any hypoglycemia. Adherent to her  Medication regimen. Would like to discuss her diet and activity level. Review of Systems  Constitutional: Negative.   Eyes: Negative for visual disturbance.  Respiratory: Negative for cough, chest tightness, shortness of breath and wheezing.   Cardiovascular: Negative for chest pain, palpitations and leg swelling.  Gastrointestinal: Negative for abdominal distention.  Genitourinary: Negative for frequency.  Musculoskeletal: Negative for joint swelling, arthralgias and gait problem.  Neurological: Negative for dizziness and weakness.  Hematological: Negative for adenopathy.  Psychiatric/Behavioral: Negative.         Objective:   Physical Exam General: Vital signs reviewed and noted. Well-developed, well-nourished, in no acute distress; alert, appropriate and cooperative throughout examination.  Head: Normocephalic, atraumatic.  Neck: No deformities, masses, or tenderness noted.  Lungs:  Normal respiratory effort. Clear to auscultation BL without crackles or wheezes.  Heart: RRR. S1 and S2 normal without gallop, murmur, or rubs.  Abdomen:  BS normoactive. Soft, Nondistended, non-tender.  No masses or organomegaly.  Extremities: No pretibial edema.          Assessment & Plan:

## 2011-05-16 NOTE — Assessment & Plan Note (Signed)
Previous lab date reviewed; diet, exercise and foot care discussed. Insulin therapy reviewed. Signs and symptoms of hypoglycemia reviewed.

## 2011-08-02 LAB — GLUCOSE, CAPILLARY: Glucose-Capillary: 204 mg/dL — ABNORMAL HIGH (ref 70–99)

## 2011-08-23 ENCOUNTER — Ambulatory Visit: Payer: Self-pay

## 2011-09-28 ENCOUNTER — Encounter: Payer: Self-pay | Admitting: Internal Medicine

## 2011-09-28 ENCOUNTER — Ambulatory Visit (INDEPENDENT_AMBULATORY_CARE_PROVIDER_SITE_OTHER): Payer: Self-pay | Admitting: Internal Medicine

## 2011-09-28 VITALS — BP 105/72 | HR 74 | Temp 97.4°F | Ht 64.0 in | Wt 157.1 lb

## 2011-09-28 DIAGNOSIS — Z23 Encounter for immunization: Secondary | ICD-10-CM

## 2011-09-28 DIAGNOSIS — E119 Type 2 diabetes mellitus without complications: Secondary | ICD-10-CM

## 2011-09-28 DIAGNOSIS — I10 Essential (primary) hypertension: Secondary | ICD-10-CM

## 2011-09-28 LAB — POCT GLYCOSYLATED HEMOGLOBIN (HGB A1C): Hemoglobin A1C: 7.7

## 2011-09-28 MED ORDER — METFORMIN HCL 1000 MG PO TABS: 1000.0000 mg | ORAL_TABLET | Freq: Two times a day (BID) | ORAL | Status: DC

## 2011-09-28 MED ORDER — LISINOPRIL 40 MG PO TABS: 40.0000 mg | ORAL_TABLET | Freq: Every day | ORAL | Status: DC

## 2011-09-28 NOTE — Progress Notes (Signed)
Subjective:   Patient ID: Stephanie Davis female   DOB: 04/08/1951 60 y.o.   MRN: [id number]  HPI: Stephanie Davis is a 60 y.o.  Woman is here for a follow up and refills on her medications.    Past Medical History  Diagnosis Date  . Hypertension   . Diabetes mellitus   . Hypercalcemia   . Hyperparathyroidism   . Depression    Current Outpatient Prescriptions  Medication Sig Dispense Refill  . amLODipine (NORVASC) 5 MG tablet Take 5 mg by mouth daily.        Marland Kitchen aspirin (ADULT ASPIRIN EC LOW STRENGTH) 81 MG EC tablet Take 81 mg by mouth daily.        . Butenafine HCl (LOTRIMIN ULTRA) 1 % cream Apply topically 2 (two) times daily.        . diphenhydramine-acetaminophen (TYLENOL PM EXTRA STRENGTH) 25-500 MG TABS Take 1 tablet by mouth at bedtime as needed. For sleep       . hydrochlorothiazide 25 MG tablet Take 1 tablet (25 mg total) by mouth every morning.  30 tablet  11  . insulin glargine (LANTUS) 100 UNIT/ML injection Take 25 units subcutaneously each night       . lisinopril (PRINIVIL,ZESTRIL) 40 MG tablet Take 1 tablet (40 mg total) by mouth daily.  30 tablet  11  . metFORMIN (GLUCOPHAGE) 1000 MG tablet Take 1 tablet (1,000 mg total) by mouth 2 (two) times daily.  60 tablet  11  . Multiple Vitamins-Minerals (MULTIVITAL PERFORMANCE PO) Take 1 tablet by mouth daily.        Marland Kitchen DISCONTD: lisinopril (PRINIVIL,ZESTRIL) 40 MG tablet Take 40 mg by mouth 2 (two) times daily.         Family History  Problem Relation Age of Onset  . Hypertension Mother   . Heart disease Father 28    myocardial infarction  . Hypertension Father   . Diabetes Sister   . Diabetes Brother    History   Social History  . Marital Status: Married    Spouse Name: N/A    Number of Children: 4  . Years of Education: 2 y colleg   Occupational History  . 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 Smoking status: Former Games developer  . Smokeless tobacco: None  . Alcohol Use: No  . Drug Use:  No  . Sexually Active: None   Other Topics Concern  . None   Social History Narrative   Married since 36 years.4 kids with 1 daughter passed from sepsis.Cell phone: [phone]   Review of Systems: Constitutional: Denies fever, chills, diaphoresis, appetite change and fatigue.  HEENT: Denies photophobia, eye pain, redness, hearing loss, ear pain, congestion, sore throat, rhinorrhea, sneezing, mouth sores, trouble swallowing, neck pain, neck stiffness and tinnitus.   Respiratory: Denies SOB, DOE, cough, chest tightness,  and wheezing.   Cardiovascular: Denies chest pain, palpitations and leg swelling.  Gastrointestinal: Denies nausea, vomiting, abdominal pain, diarrhea, constipation, blood in stool and abdominal distention.  Genitourinary: Denies dysuria, urgency, frequency, hematuria, flank pain and difficulty urinating.  Musculoskeletal: Denies myalgias, back pain, joint swelling, arthralgias and gait problem.  Skin: Denies pallor, rash and wound.  Neurological: Denies dizziness, seizures, syncope, weakness, light-headedness, numbness and headaches.  Hematological: Denies adenopathy. Easy bruising, personal or family bleeding history  Psychiatric/Behavioral: Denies suicidal ideation, mood changes, confusion, nervousness, sleep disturbance and agitation  Objective:  Physical Exam: Filed Vitals:   09/28/11  1554  BP: 105/72  Pulse: 74  Temp: 97.4 F (36.3 C)  TempSrc: Oral  Height: 5\' 4"  (1.626 m)  Weight: 157 lb 1.6 oz (71.26 kg)   Constitutional: Vital signs reviewed.  Patient is a well-developed and well-nourished woman in no acute distress and cooperative with exam. Alert and oriented x3.  Head: Normocephalic and atraumatic Ear: TM normal bilaterally Mouth: no erythema or exudates, MMM Eyes: PERRL, EOMI, conjunctivae normal, No scleral icterus.  Neck: Supple, Trachea midline normal ROM, No JVD, mass, thyromegaly, or carotid bruit present.  Cardiovascular: RRR, S1 normal, S2  normal, no MRG, pulses symmetric and intact bilaterally Pulmonary/Chest: CTAB, no wheezes, rales, or rhonchi Abdominal: Soft. Non-tender, non-distended, bowel sounds are normal, no masses, organomegaly, or guarding present.  GU: no CVA tenderness Musculoskeletal: No joint deformities, erythema, or stiffness, ROM full and no nontender Hematology: no cervical, inginal, or axillary adenopathy.  Neurological: A&O x3, Strenght is normal and symmetric bilaterally, cranial nerve II-XII are grossly intact, no focal motor deficit, sensory intact to light touch bilaterally.  Skin: Warm, dry and intact. No rash, cyanosis, or clubbing.  Psychiatric: Normal mood and affect. speech and behavior is normal. Judgment and thought content normal. Cognition and memory are normal.   Assessment & Plan:  1. DM, type 2 -HgbA1C is 7.7 -foot care reviewed -diet, exercise discussed  2. HTN, controlled -continue with current regimen  3. F/u in 4-6 months or sooner if needed.

## 2011-09-28 NOTE — Progress Notes (Deleted)
  Subjective:    Patient ID: Stephanie Davis, female    DOB: Mar 17, 1951, 60 y.o.   MRN: [id number]  HPI    Review of Systems     Objective:   Physical Exam        Assessment & Plan:

## 2011-09-28 NOTE — Patient Instructions (Signed)
Please, call with any questions and follow up with [place] in 4-6 months.

## 2011-11-02 ENCOUNTER — Other Ambulatory Visit: Payer: Self-pay | Admitting: Internal Medicine

## 2011-11-13 ENCOUNTER — Other Ambulatory Visit: Payer: Self-pay | Admitting: Internal Medicine

## 2012-05-15 ENCOUNTER — Other Ambulatory Visit: Payer: Self-pay | Admitting: *Deleted

## 2012-05-15 DIAGNOSIS — E119 Type 2 diabetes mellitus without complications: Secondary | ICD-10-CM

## 2012-05-15 DIAGNOSIS — I10 Essential (primary) hypertension: Secondary | ICD-10-CM

## 2012-05-15 MED ORDER — HYDROCHLOROTHIAZIDE 25 MG PO TABS: 25.0000 mg | ORAL_TABLET | ORAL | Status: DC

## 2012-05-15 NOTE — Telephone Encounter (Signed)
Pt has scheduled appointment 8/1, is out of meds

## 2012-05-29 ENCOUNTER — Ambulatory Visit (INDEPENDENT_AMBULATORY_CARE_PROVIDER_SITE_OTHER): Payer: Medicare Other | Admitting: Internal Medicine

## 2012-05-29 ENCOUNTER — Encounter: Payer: Self-pay | Admitting: Internal Medicine

## 2012-05-29 VITALS — BP 117/77 | HR 79 | Temp 97.8°F | Wt 157.1 lb

## 2012-05-29 DIAGNOSIS — Z23 Encounter for immunization: Secondary | ICD-10-CM

## 2012-05-29 DIAGNOSIS — Z79899 Other long term (current) drug therapy: Secondary | ICD-10-CM

## 2012-05-29 DIAGNOSIS — Z0000 Encounter for general adult medical examination without abnormal findings: Secondary | ICD-10-CM

## 2012-05-29 DIAGNOSIS — Z1231 Encounter for screening mammogram for malignant neoplasm of breast: Secondary | ICD-10-CM

## 2012-05-29 DIAGNOSIS — E119 Type 2 diabetes mellitus without complications: Secondary | ICD-10-CM

## 2012-05-29 DIAGNOSIS — I10 Essential (primary) hypertension: Secondary | ICD-10-CM

## 2012-05-29 LAB — GLUCOSE, CAPILLARY: Glucose-Capillary: 86 mg/dL (ref 70–99)

## 2012-05-29 LAB — POCT GLYCOSYLATED HEMOGLOBIN (HGB A1C): Hemoglobin A1C: 8.3

## 2012-05-29 MED ORDER — METFORMIN HCL 1000 MG PO TABS: 1000.0000 mg | ORAL_TABLET | Freq: Two times a day (BID) | ORAL | Status: DC

## 2012-05-29 MED ORDER — AMLODIPINE BESYLATE 5 MG PO TABS: 5.0000 mg | ORAL_TABLET | Freq: Every day | ORAL | Status: DC

## 2012-05-29 MED ORDER — HYDROCHLOROTHIAZIDE 25 MG PO TABS: 25.0000 mg | ORAL_TABLET | ORAL | Status: DC

## 2012-05-29 MED ORDER — LISINOPRIL 40 MG PO TABS: 40.0000 mg | ORAL_TABLET | Freq: Every day | ORAL | Status: DC

## 2012-05-29 MED ORDER — "INSULIN SYRINGE 30G X 5/16"" 0.5 ML MISC": Status: DC

## 2012-05-29 MED ORDER — INSULIN GLARGINE 100 UNIT/ML ~~LOC~~ SOLN: 23.0000 [IU] | Freq: Every day | SUBCUTANEOUS | Status: DC
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
## 2012-05-30 LAB — LIPID PANEL: Cholesterol: 162 mg/dL (ref 0–200)

## 2012-05-30 LAB — BASIC METABOLIC PANEL WITH GFR
Chloride: 107 mEq/L (ref 96–112)
GFR, Est African American: 61 mL/min
GFR, Est Non African American: 53 mL/min — ABNORMAL LOW
Potassium: 4.4 mEq/L (ref 3.5–5.3)
Sodium: 141 mEq/L (ref 135–145)

## 2012-05-30 LAB — MICROALBUMIN / CREATININE URINE RATIO
Creatinine, Urine: 52 mg/dL
Microalb, Ur: 1.38 mg/dL (ref 0.00–1.89)

## 2012-05-31 DIAGNOSIS — Z0000 Encounter for general adult medical examination without abnormal findings: Secondary | ICD-10-CM | POA: Insufficient documentation

## 2012-05-31 NOTE — Assessment & Plan Note (Signed)
-  pt due for mammogram, dilated eye exam for DM -will refer for the above  -pap smear also due, pt deferred today, will address at next visit

## 2012-05-31 NOTE — Assessment & Plan Note (Signed)
-  well-controlled -continue current medications, will refill today
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
## 2012-06-02 NOTE — Progress Notes (Signed)
INTERNAL MEDICINE TEACHING ATTENDING ADDENDUM - Inez Catalina, MD: I personally saw and evaluated [name] in this clinic visit in conjunction with the resident, Dr. Collier Bullock. I have discussed patient's plan of care with medical resident during this visit. I have confirmed the physical exam findings and have read and agree with the clinic note assessment and plan.

## 2012-06-20 ENCOUNTER — Ambulatory Visit (HOSPITAL_COMMUNITY)
Admission: RE | Admit: 2012-06-20 | Discharge: 2012-06-20 | Disposition: A | Discharge status: Home / Self Care | Payer: Medicare Other | Source: Ambulatory Visit | Attending: Internal Medicine | Admitting: Internal Medicine

## 2012-06-20 DIAGNOSIS — Z1231 Encounter for screening mammogram for malignant neoplasm of breast: Secondary | ICD-10-CM | POA: Insufficient documentation

## 2012-08-13 ENCOUNTER — Other Ambulatory Visit: Payer: Self-pay | Admitting: Internal Medicine

## 2012-08-14 NOTE — Telephone Encounter (Signed)
[facility] called and made awared of refills (rx 05/29/12 x 11 refills).

## 2012-08-20 NOTE — Addendum Note (Signed)
Addended by: Neomia Dear on: 08/20/2012 09:57 AM   Modules accepted: Orders

## 2012-10-30 ENCOUNTER — Encounter: Payer: Medicare Other | Admitting: Internal Medicine

## 2012-11-13 ENCOUNTER — Encounter: Payer: Self-pay | Admitting: Internal Medicine

## 2012-11-13 ENCOUNTER — Ambulatory Visit (INDEPENDENT_AMBULATORY_CARE_PROVIDER_SITE_OTHER): Payer: Medicare Other | Admitting: Internal Medicine

## 2012-11-13 VITALS — BP 125/81 | HR 80 | Temp 98.3°F | Wt 156.4 lb

## 2012-11-13 DIAGNOSIS — Z23 Encounter for immunization: Secondary | ICD-10-CM

## 2012-11-13 DIAGNOSIS — E119 Type 2 diabetes mellitus without complications: Secondary | ICD-10-CM

## 2012-11-13 DIAGNOSIS — I10 Essential (primary) hypertension: Secondary | ICD-10-CM

## 2012-11-13 DIAGNOSIS — Z79899 Other long term (current) drug therapy: Secondary | ICD-10-CM

## 2012-11-13 DIAGNOSIS — Z0000 Encounter for general adult medical examination without abnormal findings: Secondary | ICD-10-CM

## 2012-11-13 LAB — GLUCOSE, CAPILLARY: Glucose-Capillary: 108 mg/dL — ABNORMAL HIGH (ref 70–99)

## 2012-11-13 NOTE — Assessment & Plan Note (Signed)
BP Readings from Last 3 Encounters:  11/13/12 125/81  05/29/12 117/77  09/28/11 105/72    Lab Results  Component Value Date   NA 141 05/29/2012   K 4.4 05/29/2012   CREATININE 1.13* 05/29/2012    Assessment:  Blood pressure control: controlled  Progress toward BP goal:  at goal  Comments:   Plan:  Medications:  continue current medications  Educational resources provided: brochure  Self management tools provided:    Other plans:   Continue lisinopril, HCTZ, amlodipine. Patient is well controlled. Continue to limit dietary salt intake.

## 2012-11-13 NOTE — Progress Notes (Signed)
Patient ID: Stephanie Davis, female   DOB: 28-Aug-1951, 62 y.o.   MRN: [id number]   Internal Medicine Clinic Visit    HPI:  Stephanie Davis is a 62 y.o. year old female who presents for general follow up and refills. She has been feeling great since she was last seen in clinic, no complaints, no problems with her medications.   Patient does check her blood sugar at home and it has been running in the mid 200s. She has had one or 2 episodes of hypoglycemia in the 70s, and she was symptomatic at that time. Currently, she takes 23 units of lantus at night and oral metformin. Has been at this regimen for about 2 years.   Patient admits that she has not been following a diabetic diet. She does eat oatmeal mornings and light lunches, but she eats a large dinner and snacks at night. She does admit to eating ice cream on a nightly basis.  Patient did not bring her meter today, but she states that her blood sugar typically runs high in the afternoon and evening. She is not sure when the episodes of hypoglycemia occurred.  She denies polyuria, polydipsia, dysuria, changes in vision, fever, chills.  Patient did see her ophthalmologist recently, Dr. Hanley Seamen.     Past Medical History  Diagnosis Date  . Hypertension   . Diabetes mellitus   . Hypercalcemia   . Hyperparathyroidism   . Depression     Past Surgical History  Procedure Date  . Cesarean section 1989     ROS:  A complete review of systems was otherwise negative, except as noted in the HPI.  Allergies: Review of patient's allergies indicates no known allergies.  Medications: Current Outpatient Prescriptions  Medication Sig Dispense Refill  . amLODipine (NORVASC) 5 MG tablet Take 1 tablet (5 mg total) by mouth daily.  30 tablet  11  . aspirin (ADULT ASPIRIN EC LOW STRENGTH) 81 MG EC tablet Take 81 mg by mouth daily.        . Butenafine HCl (LOTRIMIN ULTRA) 1 % cream Apply topically 2 (two) times daily.        .  diphenhydramine-acetaminophen (TYLENOL PM EXTRA STRENGTH) 25-500 MG TABS Take 1 tablet by mouth at bedtime as needed. For sleep       . hydrochlorothiazide (HYDRODIURIL) 25 MG tablet Take 1 tablet (25 mg total) by mouth every morning.  30 tablet  11  . insulin glargine (LANTUS) 100 UNIT/ML injection Inject 23 Units into the skin at bedtime.  10 mL  10  . Insulin Syringe-Needle U-100 (INSULIN SYRINGE .5CC/30GX5/16") 30G X 5/16" 0.5 ML MISC Use to inject insulin once daily dx 250.00  100 each  11  . lisinopril (PRINIVIL,ZESTRIL) 40 MG tablet Take 1 tablet (40 mg total) by mouth daily.  30 tablet  11  . metFORMIN (GLUCOPHAGE) 1000 MG tablet Take 1 tablet (1,000 mg total) by mouth 2 (two) times daily.  60 tablet  11  . Multiple Vitamins-Minerals (MULTIVITAL PERFORMANCE PO) Take 1 tablet by mouth daily.          History   Social History  . Marital Status: Married    Spouse Name: N/A    Number of Children: 4  . Years of Education: 2 y colleg   Occupational History  . 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 Smoking status: Former Games developer  . Smokeless tobacco: Not on file  . Alcohol  Use: No  . Drug Use: No  . Sexually Active: Not on file   Other Topics Concern  . Not on file   Social History Narrative   Married since 36 years.4 kids with 1 daughter passed from sepsis.Cell phone: [phone]    family history includes Diabetes in her brother and sister; Heart disease (age of onset:78) in her father; and Hypertension in her father and mother.  Physical Exam Blood pressure 125/81, pulse 80, temperature 98.3 F (36.8 C), temperature source Oral, weight 156 lb 6.4 oz (70.943 kg), SpO2 100.00%. General:  No acute distress, alert and oriented x 3, well-appearing  HEENT:  PERRL, EOMI, no lymphadenopathy, moist mucous membranes Cardiovascular:  Regular rate and rhythm, no murmurs, rubs or gallops Respiratory:  Clear to auscultation bilaterally, no wheezes, rales, or  rhonchi Abdomen:  Soft, nondistended, nontender, normoactive bowel sounds Extremities:  Warm and well-perfused, no clubbing, cyanosis, or edema. 2+ DP pulses Scars and areas of hypo/hyperpigmentation on b/l lower extremities Skin: Warm, dry, no rashes. Neuro: Not anxious appearing, no depressed mood, normal affect   Labs: Lab Results  Component Value Date   CREATININE 1.13* 05/29/2012   BUN 20 05/29/2012   NA 141 05/29/2012   K 4.4 05/29/2012   CL 107 05/29/2012   CO2 23 05/29/2012   Lab Results  Component Value Date   WBC 8.1 08/05/2007   HGB 13.2 08/05/2007   HCT 40.9 08/05/2007   MCV 85.4 08/05/2007   PLT 233 08/05/2007   Lab Results  Component Value Date   HGBA1C 9.8 11/13/2012     Assessment and Plan:    FOLLOWUP: Phill Myron will follow back up in our clinic in approximately  2-3 months, sooner if needed. CRYTAL PENSINGER knows to call out clinic in the meantime with any questions or new issues.   Patient was seen and evaluated by Denton Ar, MD,  and Debe Coder, MD Attending Physician.

## 2012-11-13 NOTE — Assessment & Plan Note (Addendum)
Lab Results  Component Value Date   HGBA1C 9.8 11/13/2012   HGBA1C 8.3 05/29/2012   HGBA1C 7.7 09/28/2011     Assessment:  Diabetes control: poor control (HgbA1C >9%)  Progress toward A1C goal:  deteriorated    Plan:  Medications:  increase dose of Lantus to 25 units at night  Home glucose monitoring:   Frequency:     Timing:    Instruction/counseling given: reminded to bring blood glucose meter & log to each visit, discussed foot care, discussed the need for weight loss and discussed diet  Educational resources provided: brochure  Self management tools provided: home glucose logbook  Other plans:   Discussed discontinuing them nightly ice cream intake and using a reduced sugar or sugar free option. Fruit would also be another alternative for after dinner snack. Patient states that she is going to work on this.   For now, we will increase her nighttime dose of Lantus to 25 units each bedtime. Reviewed signs and symptoms of hypoglycemia and patient will call if she has questions or concerns. Otherwise, will followup in 3 months.

## 2012-11-13 NOTE — Addendum Note (Signed)
Addended by: Maura Crandall on: 11/13/2012 04:24 PM   Modules accepted: Orders

## 2012-11-13 NOTE — Assessment & Plan Note (Addendum)
Patient did get a mammogram since last visit. Patient deferred Pap smear again today. Stressed the importance of routine health maintenance. She states she'll make another appointment for just a Pap. Flu shot given today

## 2012-11-13 NOTE — Patient Instructions (Signed)
Please schedule an appointment for a Pap smear ASAP.  Otherwise, return to clinic in 3 months for followup.

## 2013-04-24 ENCOUNTER — Telehealth: Payer: Self-pay | Admitting: Dietician

## 2013-04-24 NOTE — Telephone Encounter (Signed)
Patient agreed to appointment on 05/15/13 after her doctor's appointment

## 2013-05-07 ENCOUNTER — Other Ambulatory Visit: Payer: Self-pay

## 2013-05-15 ENCOUNTER — Other Ambulatory Visit: Payer: Self-pay | Admitting: Internal Medicine

## 2013-05-15 ENCOUNTER — Ambulatory Visit (INDEPENDENT_AMBULATORY_CARE_PROVIDER_SITE_OTHER): Payer: Medicare Other | Admitting: Internal Medicine

## 2013-05-15 ENCOUNTER — Encounter: Payer: Self-pay | Admitting: Internal Medicine

## 2013-05-15 VITALS — BP 131/83 | HR 83 | Temp 99.0°F | Wt 158.2 lb

## 2013-05-15 DIAGNOSIS — Z23 Encounter for immunization: Secondary | ICD-10-CM

## 2013-05-15 DIAGNOSIS — Z0000 Encounter for general adult medical examination without abnormal findings: Secondary | ICD-10-CM

## 2013-05-15 DIAGNOSIS — E119 Type 2 diabetes mellitus without complications: Secondary | ICD-10-CM

## 2013-05-15 DIAGNOSIS — I10 Essential (primary) hypertension: Secondary | ICD-10-CM

## 2013-05-15 DIAGNOSIS — IMO0001 Reserved for inherently not codable concepts without codable children: Secondary | ICD-10-CM

## 2013-05-15 LAB — LIPID PANEL
Cholesterol: 179 mg/dL (ref 0–200)
Total CHOL/HDL Ratio: 4 Ratio
VLDL: 42 mg/dL — ABNORMAL HIGH (ref 0–40)

## 2013-05-15 LAB — BASIC METABOLIC PANEL WITH GFR
BUN: 17 mg/dL (ref 6–23)
GFR, Est African American: 53 mL/min — ABNORMAL LOW
GFR, Est Non African American: 46 mL/min — ABNORMAL LOW
Potassium: 5 mEq/L (ref 3.5–5.3)
Sodium: 138 mEq/L (ref 135–145)

## 2013-05-15 MED ORDER — AMLODIPINE BESYLATE 5 MG PO TABS: 5.0000 mg | ORAL_TABLET | Freq: Every day | ORAL | Status: DC

## 2013-05-15 MED ORDER — LISINOPRIL 40 MG PO TABS: 40.0000 mg | ORAL_TABLET | Freq: Every day | ORAL | Status: DC

## 2013-05-15 MED ORDER — HYDROCHLOROTHIAZIDE 25 MG PO TABS: 25.0000 mg | ORAL_TABLET | ORAL | Status: DC
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
## 2013-05-15 NOTE — Patient Instructions (Addendum)
General Instructions: -Please see Lupita Leash today for nutrition counseling -I have refilled your blood pressure medications -Please work on eating less sweets :) -Please get the blood lab work -Please use the stool card and return to [place] -Please schedule dilated eye exam next month -Please schedule your pap smear -Will see you in 3 months--- nice meeting you today!    Treatment Goals:  Goals (1 Years of Data) as of 05/15/13         As of Today 11/13/12 05/29/12 09/28/11 05/08/11     Blood Pressure    . Blood Pressure < 140/90  131/83 125/81 117/77 105/72      Result Component    . HEMOGLOBIN A1C < 7.0  9.9 9.8 8.3 7.7     . LDL CALC < 100    88  90      Progress Toward Treatment Goals:  Treatment Goal 05/15/2013  Hemoglobin A1C unchanged  Blood pressure at goal    Self Care Goals & Plans:  Self Care Goal 05/15/2013  Manage my medications take my medicines as prescribed; bring my medications to every visit; refill my medications on time  Monitor my health keep track of my blood glucose; bring my glucose meter and log to each visit; check my feet daily  Eat healthy foods eat foods that are low in salt; eat baked foods instead of fried foods  Be physically active find an activity I enjoy    Home Blood Glucose Monitoring 05/15/2013  Check my blood sugar 3 times a day     Care Management & Community Referrals:  Referral 05/15/2013  Referrals made for care management support nutritionist

## 2013-05-16 NOTE — Assessment & Plan Note (Addendum)
Assessment: Pt with uncontrolled diabetes, HbA1C is 9.9, not improved from six months ago. Capillary blood glucose today high at 307, reports she just ate a [place] sandwich.  Renal function (Cr) also worsening in past year. Pt taking 25 U Lantus nightly and not taking metformin as directed (taking once a day). Pt with elevated triglycerides six months ago with target  LDL goal of 70-100, will consider statin therapy. Pt's with possible diabetic neuropathy vs restless leg syndrome also common in diabetics.     Plan:  -To meet with diabetic counselor Lupita Leash Plyler today  ---> per nurse left --> will call and if pre-breakfast glucose >200 will need to add 2-3 units of Lantus to current 25 U nighttime regimen -To obtain BMP to assess renal function and random blood sugar ---> will call with results  -To obtain non-fasting lipid panel --> will call with results, will consider starting low dose statin at next visit -Will need to return in 3 months due to poorly controlled diabetes -Will consider obtaining CBC next visit to r/o iron deficency anemia if suspect restless leg syndrome      -Pt advised to take metformin twice a day as directed  -Pt to schedule annual dilated eye exam  -Pt to receive flu shot at next visit

## 2013-05-16 NOTE — Assessment & Plan Note (Signed)
Assessment: Pt due for 10 year colonoscopy next month, but at this time not agreeable to procedure. Also due for Pap smear and annual dilated eye exam.     Plan:  -To give patient stool cards with instructions to return to [place]  -Pt will make appointment for annual dilated eye exam  -Pt to make appointment for pap smear -Pt will receive annual flu shot at next 3 month follow-up visit

## 2013-05-16 NOTE — Assessment & Plan Note (Signed)
Assessment: Pt with controlled hypertension meeting diabetic BP goal of <130/80 with combination of HCTZ, Amlodipine, & Lisinopril   Plan: -To refill 5mg  Amlodipine -To refill 40mg  Lisinopril -To refill 25mg  HCTZ

## 2013-05-19 NOTE — Progress Notes (Signed)
I saw and evaluated the patient.  I personally confirmed the key portions of the history and exam documented by Dr. Rabbani and I reviewed pertinent patient test results.  The assessment, diagnosis, and plan were formulated together and I agree with the documentation in the resident's note.  

## 2013-06-13 ENCOUNTER — Other Ambulatory Visit: Payer: Self-pay | Admitting: Internal Medicine

## 2013-06-22 ENCOUNTER — Other Ambulatory Visit: Payer: Self-pay | Admitting: *Deleted

## 2013-06-22 DIAGNOSIS — E119 Type 2 diabetes mellitus without complications: Secondary | ICD-10-CM

## 2013-06-22 MED ORDER — INSULIN GLARGINE 100 UNIT/ML ~~LOC~~ SOLN: 23.0000 [IU] | Freq: Every day | SUBCUTANEOUS | Status: DC

## 2013-07-27 ENCOUNTER — Other Ambulatory Visit: Payer: Self-pay | Admitting: Internal Medicine

## 2013-07-27 DIAGNOSIS — E119 Type 2 diabetes mellitus without complications: Secondary | ICD-10-CM

## 2013-07-27 NOTE — Telephone Encounter (Signed)
This patient's creatinine levels are trending up given the last two lab values, and K is 5.0. I would like the patient to get a BMP done before continuing metformin. The patient can come any time for the labs starting tomorrow, as soon as possible.  Please call the patient regarding this.

## 2013-07-27 NOTE — Telephone Encounter (Signed)
Pt called back - scheduled a lab appt for tomorrow at 0900AM.

## 2013-07-27 NOTE — Telephone Encounter (Signed)
Pt called, no answer - left message on voice mail to call the clinic to schedule  Lab appt

## 2013-07-28 ENCOUNTER — Other Ambulatory Visit (INDEPENDENT_AMBULATORY_CARE_PROVIDER_SITE_OTHER): Payer: Medicare Other

## 2013-07-28 DIAGNOSIS — E119 Type 2 diabetes mellitus without complications: Secondary | ICD-10-CM

## 2013-07-28 LAB — BASIC METABOLIC PANEL WITH GFR
CO2: 24 mEq/L (ref 19–32)
Calcium: 10.1 mg/dL (ref 8.4–10.5)
GFR, Est African American: 53 mL/min — ABNORMAL LOW
GFR, Est Non African American: 46 mL/min — ABNORMAL LOW
Glucose, Bld: 200 mg/dL — ABNORMAL HIGH (ref 70–99)
Potassium: 4.8 mEq/L (ref 3.5–5.3)
Sodium: 138 mEq/L (ref 135–145)

## 2013-08-10 ENCOUNTER — Other Ambulatory Visit: Payer: Self-pay | Admitting: *Deleted

## 2013-08-12 MED ORDER — METFORMIN HCL 1000 MG PO TABS: 1000.0000 mg | ORAL_TABLET | Freq: Two times a day (BID) | ORAL | Status: DC

## 2013-10-02 ENCOUNTER — Other Ambulatory Visit: Payer: Self-pay | Admitting: Internal Medicine

## 2013-10-02 ENCOUNTER — Encounter: Payer: Self-pay | Admitting: Internal Medicine

## 2013-10-02 ENCOUNTER — Ambulatory Visit (INDEPENDENT_AMBULATORY_CARE_PROVIDER_SITE_OTHER): Payer: Medicare Other | Admitting: Internal Medicine

## 2013-10-02 VITALS — BP 129/82 | HR 73 | Temp 98.5°F | Ht 64.0 in | Wt 159.3 lb

## 2013-10-02 DIAGNOSIS — Z0000 Encounter for general adult medical examination without abnormal findings: Secondary | ICD-10-CM

## 2013-10-02 DIAGNOSIS — E119 Type 2 diabetes mellitus without complications: Secondary | ICD-10-CM

## 2013-10-02 DIAGNOSIS — Z23 Encounter for immunization: Secondary | ICD-10-CM

## 2013-10-02 DIAGNOSIS — E1165 Type 2 diabetes mellitus with hyperglycemia: Secondary | ICD-10-CM

## 2013-10-02 DIAGNOSIS — I10 Essential (primary) hypertension: Secondary | ICD-10-CM

## 2013-10-02 LAB — COMPLETE METABOLIC PANEL WITH GFR
ALT: 27 U/L (ref 0–35)
AST: 23 U/L (ref 0–37)
Alkaline Phosphatase: 107 U/L (ref 39–117)
BUN: 18 mg/dL (ref 6–23)
Calcium: 10.1 mg/dL (ref 8.4–10.5)
Chloride: 103 mEq/L (ref 96–112)
Creat: 1.24 mg/dL — ABNORMAL HIGH (ref 0.50–1.10)
Sodium: 135 mEq/L (ref 135–145)
Total Bilirubin: 0.2 mg/dL — ABNORMAL LOW (ref 0.3–1.2)
Total Protein: 7.5 g/dL (ref 6.0–8.3)

## 2013-10-02 LAB — POCT GLYCOSYLATED HEMOGLOBIN (HGB A1C): Hemoglobin A1C: 9.2

## 2013-10-02 LAB — GLUCOSE, CAPILLARY: Glucose-Capillary: 78 mg/dL (ref 70–99)

## 2013-10-02 MED ORDER — LISINOPRIL 40 MG PO TABS: 40.0000 mg | ORAL_TABLET | Freq: Every day | ORAL | Status: DC

## 2013-10-02 MED ORDER — HYDROCHLOROTHIAZIDE 25 MG PO TABS: 25.0000 mg | ORAL_TABLET | ORAL | Status: DC

## 2013-10-02 MED ORDER — METFORMIN HCL 1000 MG PO TABS: 1000.0000 mg | ORAL_TABLET | Freq: Two times a day (BID) | ORAL | Status: DC

## 2013-10-02 MED ORDER — AMLODIPINE BESYLATE 5 MG PO TABS: 5.0000 mg | ORAL_TABLET | Freq: Every day | ORAL | Status: DC

## 2013-10-02 NOTE — Assessment & Plan Note (Signed)
-  Pt received annual influenza vaccination on 10/02/13 -Pt declined 10 year repeat colonoscopy and stool cards at this time  -Pt declined pap smear at this time

## 2013-10-02 NOTE — Patient Instructions (Addendum)
-  Please record your blood sugar at least twice a day and bring your log in next time -Please schedule your eye exam  -Will check your blood work today -Will give your flu shot today -Will refill your medications  -Will see you back in 2 weeks

## 2013-10-02 NOTE — Assessment & Plan Note (Signed)
Assessment: Pt will well controlled hypertension, 129/82 today who is compliant with anti-hypertensive therapy.   Plan:  -BP at goal <140/80 -Continue 5 mg amlodipine, 40 mg lisinopril, 25 mg HCTZ daily
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
## 2013-10-02 NOTE — Assessment & Plan Note (Addendum)
Assessment: Pt with insulin-dependent Type II DM with last HbA1c of 9.8 on 05/15/13 slightly improved to 9.2 today with average blood sugar of 217 over the past 3 months who is compliant with 25 U Lantus daily and metformin 1000 mg twice a day with normal blood sugars in the morning and night with no episodes of symptomatic hypoglycemia most likely due to postprandial hyperglycemia. Pt may benefit from post-prandial hypoglycemic agents such amylin analogues, alpha-glucosidase inhibitors,or  meglitinide analogues if BG log on next visit indicates such a pattern.   Plan: -CBG today 78 (asymptomatic) -HbA1c 9.2 not at goal <7.0 ---> pt to check BG at least once daily and bring in log in 2 weeks for further adjustment of medications -Continue 25 U Lantus daily  -Obtain CMP --> if Cr >1.4 will hold metformin 1000 mg BID -BP at goal <140/80  -LDL at goal <100 -Encourage wt loss since BMI not at goal <25 -Pt to schedule annual dilated eye exam with opthalmologist   -No need to obtain urine microalbumin/cr ratio (already on ACEi) -Foot exam due 11/03/13 -Annual influenza vaccine today -Continue ASA 81 mg for primary CVD prevention

## 2013-10-19 NOTE — Progress Notes (Signed)
I saw and evaluated the patient.  I personally confirmed the key portions of the history and exam documented by Dr. Rabbani and I reviewed pertinent patient test results.  The assessment, diagnosis, and plan were formulated together and I agree with the documentation in the resident's note.  

## 2014-06-11 ENCOUNTER — Ambulatory Visit (INDEPENDENT_AMBULATORY_CARE_PROVIDER_SITE_OTHER): Payer: Medicare Other | Admitting: Internal Medicine

## 2014-06-11 ENCOUNTER — Encounter: Payer: Self-pay | Admitting: Internal Medicine

## 2014-06-11 VITALS — BP 117/81 | HR 71 | Temp 98.5°F | Ht 63.0 in | Wt 159.4 lb

## 2014-06-11 DIAGNOSIS — E119 Type 2 diabetes mellitus without complications: Secondary | ICD-10-CM

## 2014-06-11 DIAGNOSIS — Z0000 Encounter for general adult medical examination without abnormal findings: Secondary | ICD-10-CM

## 2014-06-11 DIAGNOSIS — I10 Essential (primary) hypertension: Secondary | ICD-10-CM

## 2014-06-11 DIAGNOSIS — E785 Hyperlipidemia, unspecified: Secondary | ICD-10-CM

## 2014-06-11 LAB — POCT GLYCOSYLATED HEMOGLOBIN (HGB A1C): HEMOGLOBIN A1C: 9.3

## 2014-06-11 LAB — GLUCOSE, CAPILLARY: Glucose-Capillary: 137 mg/dL — ABNORMAL HIGH (ref 70–99)

## 2014-06-11 MED ORDER — INSULIN ASPART PROT & ASPART (70-30 MIX) 100 UNIT/ML PEN: PEN_INJECTOR | SUBCUTANEOUS | Status: DC

## 2014-06-11 MED ORDER — HYDROCHLOROTHIAZIDE 25 MG PO TABS: 25.0000 mg | ORAL_TABLET | ORAL | Status: DC

## 2014-06-11 MED ORDER — AMLODIPINE BESYLATE 5 MG PO TABS: 5.0000 mg | ORAL_TABLET | Freq: Every day | ORAL | Status: DC

## 2014-06-11 MED ORDER — INSULIN PEN NEEDLE 31G X 5 MM MISC: Status: DC

## 2014-06-11 MED ORDER — LISINOPRIL 40 MG PO TABS: 40.0000 mg | ORAL_TABLET | Freq: Every day | ORAL | Status: DC

## 2014-06-11 MED ORDER — METFORMIN HCL 1000 MG PO TABS: 1000.0000 mg | ORAL_TABLET | Freq: Two times a day (BID) | ORAL | Status: DC

## 2014-06-11 NOTE — Assessment & Plan Note (Addendum)
Assessment: Pt with last A1c of 9.2 on 10/02/13 complaint with insulin therapy and partially compliant with oral hypoglycemic therapy with no recent symptomatic hypoglycemia who presents with CBG of 137 and mildly worsened A1c of 9.3.  Plan:  -A1c 9.3 not at goal <7, continue metformin 1000 mg BID. After consultation with Norm Parcelonna Plyler will discontinue Lantus 25 U and start Novolog 70/30 15 U before evening meal and instructed to check BS before bed & fasting AM. Pt to return in 1 month.  -BP 117/81 at goal <140/90, continue amlodipine 5 mg daily, HCTZ 25 mg daily, and lisinopril 40 mg daily  -Obtain annual lipid panel, last LDL 92 at goal <100 not on statin therapy  -Obtain annual urine microalbumin test (last one normal on 05/29/12) -Perform annual foot exam today -Pt due for annual eye exam, last one on 06/10/12. Pt to schedule appointment with outside facility. -BMI 28.24 at goal <30

## 2014-06-11 NOTE — Assessment & Plan Note (Signed)
Assessment: Pt with well-controlled hypertension compliant with three-class (ACEi, diuretic, & CCB) anti-hypertensive therapy who presents with blood pressure of 117/81.   Plan:  -BP 117/81 at goal <140/90 -Continue amlodipine 5 mg daily, HCTZ 25 mg daily, and lisinopril 40 mg daily  -Last CMP on 10/02/13, obtain BMP

## 2014-06-11 NOTE — Assessment & Plan Note (Signed)
Assessment: Pt with last lipid panel on 05/15/13 with hypertriglyceridemia not currently on statin therapy with 10-yr ASCVD risk of 13% with recommendations to start moderate or high intensity statin therapy.   Plan:  -Obtain annual lipid panel  -Consider starting moderate intensity statin at next visit

## 2014-06-11 NOTE — Patient Instructions (Addendum)
-  Inject Novolog Mix 15 U before dinner and check your blood glucose before bed and in the morning when you wake up -Keep taking metformin 1000 mg twice a day   -Stop taking Lantus  -Will check your labs today -I have refilled your medications -Will see you back in 1 month, nice seeing you today!  General Instructions:   Thank you for bringing your medicines today. This helps us keep you safe from mistakes.   Progress Toward Treatment Goals:  Treatment Goal 05/15/2013  Hemoglobin A1C unchanged  Blood pressure at goal    Self Care Goals & Plans:  Self Care Goal 06/11/2014  Manage my medications take my medicines as prescribed; bring my medications to every visit; refill my medications on time  Monitor my health keep track of my blood glucose; bring my glucose meter and log to each visit  Eat healthy foods drink diet soda or water instead of juice or soda; eat more vegetables; eat foods that are low in salt; eat baked foods instead of fried foods  Be physically active take a walk every day    Home Blood Glucose Monitoring 05/15/2013  Check my blood sugar 3 times a day     Care Management & Community Referrals:  Referral 05/15/2013  Referrals made for care management support nutritionist

## 2014-06-11 NOTE — Progress Notes (Signed)
Patient ID: Stephanie Davis, female   DOB: 1951-06-02, 63 y.o.   MRN: [id number]    Subjective:   Patient ID: Stephanie Davis female   DOB: 12/10/1950 63 y.o.   MRN: [id number]  HPI: Stephanie Davis is a 83 y.o. pleasant woman with past medical history of insulin dependent Type II DM and hypertension who presents for routine follow-up visit.   Her last A1c was 9.2 on 10/02/13. She reports compliance with Lantus 25 U daily but partial compliance with metformin 1000 mg BID which she usually takes once a day. She does not check her blood glucose but has in the past week which reveals blood glucose of 80-501. She denies symptomatic hypoglycemia. She reports polyphagia, occasional blurry vision, and peripheral neuropathy but denies polydipsia, polyuria, and foot injury/ulcer. Her diet has been poor but reports good exercise. Her weight has been stable.    She reports compliance with amlodipine 5 mg daily, HCTZ 25 mg daily, and lisinopril 40 mg daily. She reports occasional blurry vision but denies headache, chest pain, LE edema, or lightheadedness.        Past Medical History  Diagnosis Date  . Hypertension   . Diabetes mellitus   . Hyperparathyroidism   . Depression    Current Outpatient Prescriptions  Medication Sig Dispense Refill  . amLODipine (NORVASC) 5 MG tablet Take 1 tablet (5 mg total) by mouth daily.  90 tablet  3  . aspirin (ADULT ASPIRIN EC LOW STRENGTH) 81 MG EC tablet Take 81 mg by mouth daily.        . Butenafine HCl (LOTRIMIN ULTRA) 1 % cream Apply topically 2 (two) times daily.        . diphenhydramine-acetaminophen (TYLENOL PM EXTRA STRENGTH) 25-500 MG TABS Take 1 tablet by mouth at bedtime as needed. For sleep       . hydrochlorothiazide (HYDRODIURIL) 25 MG tablet Take 1 tablet (25 mg total) by mouth every morning.  90 tablet  3  . Insulin Aspart Prot & Aspart (NOVOLOG MIX 70/30 FLEXPEN) (70-30) 100 UNIT/ML Pen Inject 15 units right before your evening meal  15 mL  12    . Insulin Pen Needle (B-D UF III MINI PEN NEEDLES) 31G X 5 MM MISC Use to inejct insulin once daily  100 each  5  . Insulin Syringe-Needle U-100 (INSULIN SYRINGE .5CC/30GX5/16") 30G X 5/16" 0.5 ML MISC Use to inject insulin once daily dx 250.00  100 each  11  . lisinopril (PRINIVIL,ZESTRIL) 40 MG tablet Take 1 tablet (40 mg total) by mouth daily.  90 tablet  3  . metFORMIN (GLUCOPHAGE) 1000 MG tablet Take 1 tablet (1,000 mg total) by mouth 2 (two) times daily with a meal.  180 tablet  3  . Multiple Vitamins-Minerals (MULTIVITAL PERFORMANCE PO) Take 1 tablet by mouth daily.         No current facility-administered medications for this visit.   Family History  Problem Relation Age of Onset  . Hypertension Mother   . Heart disease Father 50    myocardial infarction  . Hypertension Father   . Diabetes Sister   . Diabetes Brother    History   Social History  . Marital Status: Married    Spouse Name: N/A    Number of Children: 4  . Years of Education: 2 y colleg   Occupational History  . 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 Smoking status: Former Games developer  .  Smokeless tobacco: None  . Alcohol Use: No  . Drug Use: No  . Sexual Activity: None   Other Topics Concern  . None   Social History Narrative   Married since 36 years.   4 kids with 1 daughter passed from sepsis.   Cell phone: [phone]            Review of Systems: Review of Systems  Constitutional: Negative for fever and chills.  Eyes: Positive for blurred vision (occasionally).  Respiratory: Negative for cough and shortness of breath.   Cardiovascular: Negative for chest pain and leg swelling.  Gastrointestinal: Negative for nausea, vomiting, abdominal pain, diarrhea and constipation.  Genitourinary: Negative for dysuria, urgency and frequency.  Musculoskeletal: Negative for myalgias.  Neurological: Positive for sensory change (chronic peripheral neuropathy). Negative for dizziness and  headaches.  Endo/Heme/Allergies: Negative for polydipsia.       Polyphagia  Psychiatric/Behavioral: Negative for depression.    Objective:  Physical Exam: Filed Vitals:   06/11/14 1355  BP: 117/81  Pulse: 71  Temp: 98.5 F (36.9 C)  TempSrc: Oral  Height: 5\' 3"  (1.6 m)  Weight: 159 lb 6.4 oz (72.303 kg)  SpO2: 99%    Physical Exam  Constitutional: She is oriented to person, place, and time. She appears well-developed and well-nourished. No distress.  HENT:  Head: Normocephalic and atraumatic.  Right Ear: External ear normal.  Left Ear: External ear normal.  Nose: Nose normal.  Mouth/Throat: Oropharynx is clear and moist. No oropharyngeal exudate.  Eyes: Conjunctivae and EOM are normal. Pupils are equal, round, and reactive to light. Right eye exhibits no discharge. Left eye exhibits no discharge. No scleral icterus.  Neck: Normal range of motion. Neck supple.  Cardiovascular: Normal rate and regular rhythm.   Pulmonary/Chest: Effort normal and breath sounds normal. No respiratory distress. She has no wheezes. She has no rales.  Abdominal: Soft. Bowel sounds are normal. She exhibits no distension. There is no tenderness. There is no rebound and no guarding.  Musculoskeletal: Normal range of motion. She exhibits no edema and no tenderness.  Neurological: She is alert and oriented to person, place, and time.  Skin: Skin is warm and dry. No rash noted. She is not diaphoretic. No erythema. No pallor.  Psychiatric: She has a normal mood and affect. Her behavior is normal. Judgment and thought content normal.    Assessment & Plan:   Please see problem list for problem-based assessment and plan.

## 2014-06-11 NOTE — Assessment & Plan Note (Signed)
Pt declines screening colonoscopy and pap smear testing at this time.

## 2014-06-12 LAB — LIPID PANEL
Cholesterol: 150 mg/dL (ref 0–200)
HDL: 36 mg/dL — ABNORMAL LOW (ref >39)
LDL CALC: 82 mg/dL (ref 0–99)
Total CHOL/HDL Ratio: 4.2 Ratio
Triglycerides: 160 mg/dL — ABNORMAL HIGH (ref <150)
VLDL: 32 mg/dL (ref 0–40)

## 2014-06-12 LAB — BASIC METABOLIC PANEL WITH GFR
BUN: 16 mg/dL (ref 6–23)
CHLORIDE: 101 meq/L (ref 96–112)
CO2: 25 mEq/L (ref 19–32)
CREATININE: 1.21 mg/dL — AB (ref 0.50–1.10)
Calcium: 9.5 mg/dL (ref 8.4–10.5)
GFR, EST NON AFRICAN AMERICAN: 48 mL/min — AB
GFR, Est African American: 56 mL/min — ABNORMAL LOW
GLUCOSE: 90 mg/dL (ref 70–99)
Potassium: 4.6 mEq/L (ref 3.5–5.3)
Sodium: 138 mEq/L (ref 135–145)

## 2014-06-12 LAB — MICROALBUMIN / CREATININE URINE RATIO
Creatinine, Urine: 60.6 mg/dL
MICROALB/CREAT RATIO: 94.6 mg/g — AB (ref 0.0–30.0)
Microalb, Ur: 5.73 mg/dL — ABNORMAL HIGH (ref 0.00–1.89)

## 2014-06-14 NOTE — Progress Notes (Signed)
INTERNAL MEDICINE TEACHING ATTENDING ADDENDUM - Jiayi Lengacher, MD: I reviewed and discussed at the time of visit with the resident Dr. Rabbani, the patient's medical history, physical examination, diagnosis and results of pertinent tests and treatment and I agree with the patient's care as documented.  

## 2014-06-15 ENCOUNTER — Encounter: Payer: Self-pay | Admitting: Dietician

## 2014-06-15 ENCOUNTER — Ambulatory Visit (INDEPENDENT_AMBULATORY_CARE_PROVIDER_SITE_OTHER): Payer: Medicare Other | Admitting: Dietician

## 2014-06-15 ENCOUNTER — Other Ambulatory Visit: Payer: Self-pay | Admitting: Dietician

## 2014-06-15 ENCOUNTER — Telehealth: Payer: Self-pay | Admitting: Dietician

## 2014-06-15 DIAGNOSIS — E119 Type 2 diabetes mellitus without complications: Secondary | ICD-10-CM

## 2014-06-15 NOTE — Progress Notes (Unsigned)
Request a referral for diabetes self management training.

## 2014-06-15 NOTE — Telephone Encounter (Signed)
Thank you Donna 

## 2014-06-15 NOTE — Telephone Encounter (Signed)
Patient wanted to know how to use insuln pen and why she was switched to new insulin. Discussed the differencen between lantus and Novolog Mix 70/30 with patient. She verbalized understanding. She scheduled an appointment with CDE for 2 Pm today to learn how to use the pen and about  Her new insulin

## 2014-06-15 NOTE — Progress Notes (Signed)
  Medical Nutrition Therapy:  Appt start time: 1400 end time:  1430.  Assessment:  Primary concerns today: Education about insulin.  Patient is here with granddaughter, Stephanie Davis to learn about her new insulin. Says she eats bedtime snack currently with lantus insulin. Preferred Learning Style: Visual and Hands on  Learning Readiness: Ready  MEDICATIONS: transitioning from basal insulin only to basal and bolus, Estimated TDD is 38 units/day, starting with 15 units Novolog Mix 70/30 once daily before dinner.   DIETARY INTAKE: Usual eating pattern includes 3 meals and 1 snacks per day. Everyday foods include meats and vegetables, fruits, oatmeal.  Avoided foods include soda, juice, milk.   24-hr recall:  B (  8 AM): oatmeal with water and medicine L ( 11 AM - 12 PM): whatever she wants- mostly meat and vegetables on Sunday's is out and later- shrimp and vegetables D ( 6-8 PM): vegetables, meat, pintos, potatoes on Sunday eats a sandwich Snk (10-11 PM): ice cream and [employer] Beverages: water  Usual physical activity: cooks daily except sundays, watches grandchildren  Estimated energy needs: 1600-1800 calories 180-200 g carbohydrates 80-90 g protein 60-65 g fat  Progress Towards Goal(s):  In progress.   Nutritional Diagnosis:  NB-1.1 Food and nutrition-related knowledge deficit As related to lack of previous exposure of diabetes and insulin education and training.  As evidenced by her report and many questions and lack of knowledge about insulin action/storage..    Intervention:  Nutrition education about insulin types, action, storage and timing with meal, Recommended 3-4 servings carbs after . Offered education on signs and symptoms of high and lo blood sugar but patient declined. Asked her to check blood sugar before dinner, bedtime and breakfast as well as nocturnally if she awakened with symptom,s  Teaching Method Utilized: Visual and Hands on Handouts given during visit  include:  Insulin action and Novolog Mix 70/30 insulin pen  Barriers to learning/adherence to lifestyle change: per patient she is resistant to change   Demonstrated degree of understanding via:  Teach Back   Monitoring/Evaluation:  Dietary intake, exercise, meter, and body weight prn.

## 2014-12-09 ENCOUNTER — Ambulatory Visit (INDEPENDENT_AMBULATORY_CARE_PROVIDER_SITE_OTHER): Payer: Medicare Other | Admitting: Internal Medicine

## 2014-12-09 ENCOUNTER — Encounter: Payer: Self-pay | Admitting: Internal Medicine

## 2014-12-09 VITALS — BP 144/85 | HR 79 | Temp 98.3°F | Wt 149.5 lb

## 2014-12-09 DIAGNOSIS — N183 Chronic kidney disease, stage 3 unspecified: Secondary | ICD-10-CM

## 2014-12-09 DIAGNOSIS — Z0000 Encounter for general adult medical examination without abnormal findings: Secondary | ICD-10-CM

## 2014-12-09 DIAGNOSIS — Z1239 Encounter for other screening for malignant neoplasm of breast: Secondary | ICD-10-CM

## 2014-12-09 DIAGNOSIS — Z1211 Encounter for screening for malignant neoplasm of colon: Secondary | ICD-10-CM

## 2014-12-09 DIAGNOSIS — E1122 Type 2 diabetes mellitus with diabetic chronic kidney disease: Secondary | ICD-10-CM

## 2014-12-09 DIAGNOSIS — Z23 Encounter for immunization: Secondary | ICD-10-CM

## 2014-12-09 DIAGNOSIS — I10 Essential (primary) hypertension: Secondary | ICD-10-CM

## 2014-12-09 LAB — CBC WITH DIFFERENTIAL/PLATELET
BASOS PCT: 0 % (ref 0–1)
Basophils Absolute: 0 10*3/uL (ref 0.0–0.1)
EOS ABS: 0.2 10*3/uL (ref 0.0–0.7)
Eosinophils Relative: 2 % (ref 0–5)
HCT: 35.4 % — ABNORMAL LOW (ref 36.0–46.0)
Hemoglobin: 11.2 g/dL — ABNORMAL LOW (ref 12.0–15.0)
LYMPHS ABS: 3.3 10*3/uL (ref 0.7–4.0)
Lymphocytes Relative: 38 % (ref 12–46)
MCH: 26.5 pg (ref 26.0–34.0)
MCHC: 31.6 g/dL (ref 30.0–36.0)
MCV: 83.9 fL (ref 78.0–100.0)
MPV: 10.4 fL (ref 8.6–12.4)
Monocytes Absolute: 0.6 10*3/uL (ref 0.1–1.0)
Monocytes Relative: 7 % (ref 3–12)
Neutro Abs: 4.6 10*3/uL (ref 1.7–7.7)
Neutrophils Relative %: 53 % (ref 43–77)
Platelets: 307 10*3/uL (ref 150–400)
RBC: 4.22 MIL/uL (ref 3.87–5.11)
RDW: 13.7 % (ref 11.5–15.5)
WBC: 8.7 10*3/uL (ref 4.0–10.5)

## 2014-12-09 LAB — POCT GLYCOSYLATED HEMOGLOBIN (HGB A1C): HEMOGLOBIN A1C: 9.7

## 2014-12-09 LAB — GLUCOSE, CAPILLARY: Glucose-Capillary: 271 mg/dL — ABNORMAL HIGH (ref 70–99)

## 2014-12-09 MED ORDER — INSULIN ASPART PROT & ASPART (70-30 MIX) 100 UNIT/ML PEN: PEN_INJECTOR | SUBCUTANEOUS | Status: DC

## 2014-12-09 NOTE — Progress Notes (Signed)
Patient ID: Stephanie Davis, female   DOB: Mar 01, 1951, 64 y.o.   MRN: [id number]     Subjective:   Patient ID: Stephanie MyronVanessa R Davis female   DOB: Mar 01, 1951 64 y.o.   MRN: [id number]  HPI: Stephanie Davis is a 64 y.o.  pleasant woman with past medical history of insulin-dependent Type II DM and hypertension who presents for follow-up visit of diabetes.   Her last A1c was 9.3 on 06/11/14. She reports compliance with metformin 1000 mg BID and Novolog (70/30) 15 U daily. She was supposed to return in 1 month after last visit in August but did not. She reports checking her blood glucose 2-3 times daily with range of 100-300's. She forgot to bring her glucose meter in today. She denies symptomatic hypoglycemia. She reports chronic blurry vision, polydipsia, polyphagia, and peripheral neuropathy but denies polyuria or foot injury/ulcer. Her diet is poor but reports good exercise. She has lost 10 lb since last visit 6 months ago. She is due for annual eye exam and had evidence of glaucoma at last visit.   She reports compliance with taking amlodipine, HCTZ, and lisinopril for hypertension. She denies headache, chest pain, LE edema, or lightheadedness.   She declines starting statin therapy and would like to try diet modification at this time.   She would to have mammogram, colonoscopy, and flu shot.     Past Medical History  Diagnosis Date  . Hypertension   . Diabetes mellitus   . Hyperparathyroidism   . Depression    Current Outpatient Prescriptions  Medication Sig Dispense Refill  . amLODipine (NORVASC) 5 MG tablet Take 1 tablet (5 mg total) by mouth daily. 90 tablet 3  . aspirin (ADULT ASPIRIN EC LOW STRENGTH) 81 MG EC tablet Take 81 mg by mouth daily.      . Butenafine HCl (LOTRIMIN ULTRA) 1 % cream Apply topically 2 (two) times daily.      . diphenhydramine-acetaminophen (TYLENOL PM EXTRA STRENGTH) 25-500 MG TABS Take 1 tablet by mouth at bedtime as needed. For sleep     .  hydrochlorothiazide (HYDRODIURIL) 25 MG tablet Take 1 tablet (25 mg total) by mouth every morning. 90 tablet 3  . Insulin Aspart Prot & Aspart (NOVOLOG MIX 70/30 FLEXPEN) (70-30) 100 UNIT/ML Pen Inject 15 units right before your evening meal 15 mL 12  . Insulin Pen Needle (B-D UF III MINI PEN NEEDLES) 31G X 5 MM MISC Use to inejct insulin once daily 100 each 5  . Insulin Syringe-Needle U-100 (INSULIN SYRINGE .5CC/30GX5/16") 30G X 5/16" 0.5 ML MISC Use to inject insulin once daily dx 250.00 100 each 11  . lisinopril (PRINIVIL,ZESTRIL) 40 MG tablet Take 1 tablet (40 mg total) by mouth daily. 90 tablet 3  . metFORMIN (GLUCOPHAGE) 1000 MG tablet Take 1 tablet (1,000 mg total) by mouth 2 (two) times daily with a meal. 180 tablet 3  . Multiple Vitamins-Minerals (MULTIVITAL PERFORMANCE PO) Take 1 tablet by mouth daily.       No current facility-administered medications for this visit.   Family History  Problem Relation Age of Onset  . Hypertension Mother   . Heart disease Father [id number]    myocardial infarction  . Hypertension Father   . Diabetes Sister   . Diabetes Brother    History   Social History  . Marital Status: Married    Spouse Name: N/A  . Number of Children: 4  . Years of Education: 2 y colleg   Occupational History  .  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 Smoking status: Former Games developer  . Smokeless tobacco: Not on file  . Alcohol Use: No  . Drug Use: No  . Sexual Activity: Not on file   Other Topics Concern  . Not on file   Social History Narrative   Married since 36 years.   4 kids with 1 daughter passed from sepsis.   Cell phone: [phone]            Review of Systems: Review of Systems  Constitutional: Negative for fever and chills.  HENT: Negative for congestion and sore throat.   Eyes: Positive for blurred vision (chronic).  Respiratory: Negative for cough, shortness of breath and wheezing.   Cardiovascular: Negative for chest pain and  leg swelling.  Gastrointestinal: Negative for nausea, vomiting, abdominal pain, diarrhea, constipation and blood in stool.  Genitourinary: Negative for dysuria, urgency, frequency and hematuria.       Chronic polyuria  Musculoskeletal: Negative for myalgias.  Neurological: Positive for sensory change (chronic peripheral neuropathy ). Negative for dizziness and headaches.  Endo/Heme/Allergies: Positive for polydipsia (chronic).    Objective:  Physical Exam: Filed Vitals:   12/09/14 1426  BP: 144/85  Pulse: 79  Temp: 98.3 F (36.8 C)  TempSrc: Oral  Weight: 149 lb 8 oz (67.813 kg)  SpO2: 100%    Physical Exam  Constitutional: She is oriented to person, place, and time. She appears well-developed and well-nourished. No distress.  HENT:  Head: Normocephalic and atraumatic.  Right Ear: External ear normal.  Left Ear: External ear normal.  Nose: Nose normal.  Mouth/Throat: Oropharynx is clear and moist. No oropharyngeal exudate.  Eyes: Conjunctivae and EOM are normal. Pupils are equal, round, and reactive to light. Right eye exhibits no discharge. Left eye exhibits no discharge. No scleral icterus.  Neck: Normal range of motion. Neck supple.  Cardiovascular: Normal rate, regular rhythm and normal heart sounds.   Pulmonary/Chest: Effort normal and breath sounds normal. No respiratory distress. She has no wheezes. She has no rales.  Abdominal: Soft. Bowel sounds are normal. She exhibits no distension. There is no tenderness. There is no rebound and no guarding.  Musculoskeletal: Normal range of motion. She exhibits no edema or tenderness.  Neurological: She is alert and oriented to person, place, and time.  Skin: Skin is warm and dry. No rash noted. She is not diaphoretic. No erythema. No pallor.  Psychiatric: She has a normal mood and affect. Her behavior is normal. Judgment and thought content normal.    Assessment & Plan:   Please see problem list for problem-based assessment  and plan

## 2014-12-09 NOTE — Assessment & Plan Note (Signed)
Assessment: Pt with well-controlled hypertension compliant with three-class (ACEi, diuretic, & CCB) anti-hypertensive therapy who presents with blood pressure of 144/85.   Plan:  -BP 144/85 near goal <140/90 -Continue amlodipine 5 mg daily, HCTZ 25 mg daily, and lisinopril 40 mg daily  -Obtain BMP

## 2014-12-09 NOTE — Patient Instructions (Addendum)
-  Start taking 15 U before breakfast and then 15 U before dinner, if your dinner blood sugar is >150 on 2 days in a row then increase your morning insulin dose by 3 U, if your morning blood sugar is >150 on 2 days then increase your dinner insulin dose by 3 Units  -Will refer you for mammogram, colonoscopy and eye exam  -Will give you a flu shot today and check your bloodwork -Please come to our pap clinic  -Please come back in 2-3 month and bring your glucose meter   General Instructions:   Thank you for bringing your medicines today. This helps us keep you safe from mistakes.   Progress Toward Treatment Goals:  Treatment Goal 05/15/2013  Hemoglobin A1C unchanged  Blood pressure at goal    Self Care Goals & Plans:  Self Care Goal 06/11/2014  Manage my medications take my medicines as prescribed; bring my medications to every visit; refill my medications on time  Monitor my health keep track of my blood glucose; bring my glucose meter and log to each visit  Eat healthy foods drink diet soda or water instead of juice or soda; eat more vegetables; eat foods that are low in salt; eat baked foods instead of fried foods  Be physically active take a walk every day    Home Blood Glucose Monitoring 05/15/2013  Check my blood sugar 3 times a day     Care Management & Community Referrals:  Referral 05/15/2013  Referrals made for care management support nutritionist

## 2014-12-09 NOTE — Assessment & Plan Note (Signed)
Assessment: Pt with last A1c of 9.3 on 06/11/14 complaint with insulin therapy and oral hypoglycemic therapy with no recent symptomatic hypoglycemia who presents with CBG of 271 and mildly worsened A1c of 9.7.  Plan:  -A1c 9.7 not at goal <7, continue metformin 1000 mg BID. After consultation with Lupita Leashonna Plyler increase Novolog 70/30 15 U daily to BID with instructions to increase PM dose by 3 U if AM BS >150 for 2 days and to increase AM dose by 3 U if PM BS >150 for 2 days  -BP 144/85 not at goal <140/90, continue amlodipine 5 mg daily, HCTZ 25 mg daily, and lisinopril 40 mg daily  -Last LDL 82 at goal <100 not on statin therapy, pt declines starting statin therapy   -Last annual foot exam and annual urine microalbumin test (94.6 g proteinuria) on 06/11/14 -Referral to opthalmology in setting of glaucoma -BMI 26.49 at goal <30

## 2014-12-09 NOTE — Assessment & Plan Note (Addendum)
-  Refer to GI for screening colonoscopy -Order placed for digital bilateral screening mammogram -Administer annual influenza vaccination -Obtain annual HIV Ab and CBC w/diff -Pt encouraged to attend upcoming pap smear clinic

## 2014-12-10 LAB — BASIC METABOLIC PANEL WITH GFR
BUN: 17 mg/dL (ref 6–23)
CALCIUM: 9.6 mg/dL (ref 8.4–10.5)
CO2: 24 mEq/L (ref 19–32)
Chloride: 98 mEq/L (ref 96–112)
Creat: 1.31 mg/dL — ABNORMAL HIGH (ref 0.50–1.10)
GFR, EST AFRICAN AMERICAN: 51 mL/min — AB
GFR, Est Non African American: 44 mL/min — ABNORMAL LOW
Glucose, Bld: 256 mg/dL — ABNORMAL HIGH (ref 70–99)
POTASSIUM: 4.3 meq/L (ref 3.5–5.3)
SODIUM: 132 meq/L — AB (ref 135–145)

## 2014-12-10 LAB — HIV ANTIBODY (ROUTINE TESTING W REFLEX): HIV: NONREACTIVE

## 2014-12-10 NOTE — Progress Notes (Signed)
Internal Medicine Clinic Attending Date of visit: 12/09/2014  Case discussed with Dr. Rabbani  on the day of the visit.  We reviewed the resident's history and exam and pertinent patient test results.  I agree with the assessment, diagnosis, and plan of care documented in the resident's note. 

## 2014-12-17 ENCOUNTER — Ambulatory Visit (HOSPITAL_COMMUNITY): Payer: Medicare Other

## 2014-12-23 ENCOUNTER — Ambulatory Visit (HOSPITAL_COMMUNITY): Payer: Medicare Other

## 2015-01-06 ENCOUNTER — Encounter: Payer: Self-pay | Admitting: *Deleted

## 2015-01-20 NOTE — Addendum Note (Signed)
Addended by: Neomia DearPOWERS, Emya Picado E on: 01/20/2015 07:05 PM   Modules accepted: Orders

## 2015-05-20 ENCOUNTER — Encounter: Payer: Self-pay | Admitting: Internal Medicine

## 2015-05-20 ENCOUNTER — Ambulatory Visit (INDEPENDENT_AMBULATORY_CARE_PROVIDER_SITE_OTHER): Payer: Medicare Other | Admitting: Internal Medicine

## 2015-05-20 VITALS — BP 148/80 | HR 64 | Temp 98.1°F | Wt 145.2 lb

## 2015-05-20 DIAGNOSIS — Z0000 Encounter for general adult medical examination without abnormal findings: Secondary | ICD-10-CM

## 2015-05-20 DIAGNOSIS — E1122 Type 2 diabetes mellitus with diabetic chronic kidney disease: Secondary | ICD-10-CM

## 2015-05-20 DIAGNOSIS — E1165 Type 2 diabetes mellitus with hyperglycemia: Secondary | ICD-10-CM

## 2015-05-20 DIAGNOSIS — N183 Chronic kidney disease, stage 3 (moderate): Secondary | ICD-10-CM | POA: Diagnosis not present

## 2015-05-20 DIAGNOSIS — I10 Essential (primary) hypertension: Secondary | ICD-10-CM

## 2015-05-20 DIAGNOSIS — Z794 Long term (current) use of insulin: Secondary | ICD-10-CM

## 2015-05-20 DIAGNOSIS — I129 Hypertensive chronic kidney disease with stage 1 through stage 4 chronic kidney disease, or unspecified chronic kidney disease: Secondary | ICD-10-CM | POA: Diagnosis not present

## 2015-05-20 DIAGNOSIS — Z79899 Other long term (current) drug therapy: Secondary | ICD-10-CM

## 2015-05-20 LAB — POCT GLYCOSYLATED HEMOGLOBIN (HGB A1C): Hemoglobin A1C: 7.8

## 2015-05-20 LAB — GLUCOSE, CAPILLARY: GLUCOSE-CAPILLARY: 162 mg/dL — AB (ref 65–99)

## 2015-05-20 MED ORDER — INSULIN ASPART PROT & ASPART (70-30 MIX) 100 UNIT/ML PEN: PEN_INJECTOR | SUBCUTANEOUS | Status: DC

## 2015-05-20 MED ORDER — AMLODIPINE BESYLATE 10 MG PO TABS
10.0000 mg | ORAL_TABLET | Freq: Every day | ORAL | Status: DC
Start: 2015-05-20 — End: 2016-06-06

## 2015-05-20 NOTE — Progress Notes (Signed)
Patient ID: Stephanie Davis, female   DOB: [birth year], 64 y.o.   MRN: [id number]    Subjective:   Patient ID: Stephanie Davis female   DOB: [birth year] 48 y.o.   MRN: [id number]  HPI: Stephanie Davis is a 87 y.o. very pleasant woman with past medical history of insulin-dependent Type II DM, CKD Stage 3, hypertension, hyperlipidemia, and allergic rhinitis who presents for follow-up visit of diabetes.   Her last A1c was 9.7 on 12/09/14. She reports compliance with taking metformin 1000 mg BID and Novolog (70/30) 15 U daily. She checks her blood glucose 1-2 times daily and brought her meter today which reveals range of 75-341 with average in 150-160's. She denies symptomatic hypoglycemia. She has chronic peripheral neuropathy but denies blurry vision, polydipsia, polyphagia, polyuria, or foot injury/ulceration. She has cut back on drinking soda and candy but continues to eat ice cream. She exercises regularly. She has lost 4 lb since last visit 5 months ago. She is due for annual eye exam and had evidence of glaucoma on last one in August of 2013.   She reports compliance with taking amlodipine, HCTZ, and lisinopril for hypertension. She has occasional headache and lightheadedness but denies chest pain or LE edema.   She declines starting statin therapy and wants to continue diet modification.   She declines pap smear, mammogram, colonoscopy. She also declines tdap and shingles vaccination.       Past Medical History  Diagnosis Date  . Hypertension   . Diabetes mellitus   . Hyperparathyroidism   . Depression    Current Outpatient Prescriptions  Medication Sig Dispense Refill  . amLODipine (NORVASC) 5 MG tablet Take 1 tablet (5 mg total) by mouth daily. 90 tablet 3  . aspirin (ADULT ASPIRIN EC LOW STRENGTH) 81 MG EC tablet Take 81 mg by mouth daily.      . Butenafine HCl (LOTRIMIN ULTRA) 1 % cream Apply topically 2 (two) times daily.      . diphenhydramine-acetaminophen (TYLENOL PM EXTRA  STRENGTH) 25-500 MG TABS Take 1 tablet by mouth at bedtime as needed. For sleep     . hydrochlorothiazide (HYDRODIURIL) 25 MG tablet Take 1 tablet (25 mg total) by mouth every morning. 90 tablet 3  . insulin aspart protamine - aspart (NOVOLOG MIX 70/30 FLEXPEN) (70-30) 100 UNIT/ML FlexPen Inject 15 units twice a day, before breakfast and then before supper. 15 mL 12  . Insulin Pen Needle (B-D UF III MINI PEN NEEDLES) 31G X 5 MM MISC Use to inejct insulin once daily 100 each 5  . Insulin Syringe-Needle U-100 (INSULIN SYRINGE .5CC/30GX5/16") 30G X 5/16" 0.5 ML MISC Use to inject insulin once daily dx 250.00 100 each 11  . lisinopril (PRINIVIL,ZESTRIL) 40 MG tablet Take 1 tablet (40 mg total) by mouth daily. 90 tablet 3  . metFORMIN (GLUCOPHAGE) 1000 MG tablet Take 1 tablet (1,000 mg total) by mouth 2 (two) times daily with a meal. 180 tablet 3  . Multiple Vitamins-Minerals (MULTIVITAL PERFORMANCE PO) Take 1 tablet by mouth daily.       No current facility-administered medications for this visit.   Family History  Problem Relation Age of Onset  . Hypertension Mother   . Heart disease Father 42    myocardial infarction  . Hypertension Father   . Diabetes Sister   . Diabetes Brother    History   Social History  . Marital Status: Married    Spouse Name: N/A  . Number of Children: 4  .  Years of Education: 2 y colleg   Occupational History  . 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 Smoking status: Former Games developer  . Smokeless tobacco: Not on file  . Alcohol Use: No  . Drug Use: No  . Sexual Activity: Not on file   Other Topics Concern  . Not on file   Social History Narrative   Married since 36 years.   4 kids with 1 daughter passed from sepsis.   Cell phone: [phone]            Review of Systems: Review of Systems  Constitutional: Positive for weight loss (intentional ).  Eyes: Negative for blurred vision.  Respiratory: Negative for cough, shortness  of breath and wheezing.   Cardiovascular: Negative for chest pain and leg swelling.  Gastrointestinal: Negative for nausea, vomiting, abdominal pain, diarrhea, constipation, blood in stool and melena.  Genitourinary: Negative for dysuria, urgency, frequency and hematuria.  Musculoskeletal: Negative for myalgias.  Neurological: Positive for dizziness (occacsional lightheadedness ), sensory change (chronic peripheral neuropathy ) and headaches (occasionally ).  Endo/Heme/Allergies: Negative for polydipsia.     Objective:  Physical Exam: Filed Vitals:   05/20/15 1634  BP: 148/80  Pulse: 64  Temp: 98.1 F (36.7 C)  TempSrc: Oral  Weight: 145 lb 3.2 oz (65.862 kg)  SpO2: 100%    Physical Exam  Constitutional: She is oriented to person, place, and time. She appears well-developed and well-nourished. No distress.  HENT:  Head: Normocephalic and atraumatic.  Right Ear: External ear normal.  Left Ear: External ear normal.  Nose: Nose normal.  Mouth/Throat: Oropharynx is clear and moist. No oropharyngeal exudate.  Eyes: Conjunctivae and EOM are normal. Pupils are equal, round, and reactive to light. Right eye exhibits no discharge. Left eye exhibits no discharge. No scleral icterus.  Neck: Normal range of motion. Neck supple.  Cardiovascular: Normal rate, regular rhythm and normal heart sounds.   No murmur heard. Pulmonary/Chest: Effort normal and breath sounds normal. No respiratory distress. She has no wheezes. She has no rales.  Abdominal: Soft. Bowel sounds are normal. She exhibits no distension. There is no tenderness. There is no rebound and no guarding.  Musculoskeletal: Normal range of motion. She exhibits no edema or tenderness.  Neurological: She is alert and oriented to person, place, and time.  Skin: Skin is warm and dry. No rash noted. She is not diaphoretic. No erythema. No pallor.  Psychiatric: She has a normal mood and affect. Her behavior is normal. Judgment and thought  content normal.    Assessment & Plan:   Please see problem list for problem-based assessment and plan

## 2015-05-20 NOTE — Patient Instructions (Signed)
-  Congratulations on improving your A1c to 7.8 from 9.7!! Keep taking Novolog 15 U daily and metformin 1000 mg twice a day. Keep checking your sugars and bring your meter next time -Increase your norvasc to 10 mg daily (take two of the 5 mg tablets at one time) for your blood pressure -Please come back in 3 months, happy belated birthday!!   General Instructions:   Thank you for bringing your medicines today. This helps [place] keep you safe from mistakes.   Progress Toward Treatment Goals:  Treatment Goal 05/15/2013  Hemoglobin A1C unchanged  Blood pressure at goal    Self Care Goals & Plans:  Self Care Goal 06/11/2014  Manage my medications take my medicines as prescribed; bring my medications to every visit; refill my medications on time  Monitor my health keep track of my blood glucose; bring my glucose meter and log to each visit  Eat healthy foods drink diet soda or water instead of juice or soda; eat more vegetables; eat foods that are low in salt; eat baked foods instead of fried foods  Be physically active take a walk every day    Home Blood Glucose Monitoring 05/15/2013  Check my blood sugar 3 times a day     Care Management & Community Referrals:  Referral 05/15/2013  Referrals made for care management support nutritionist

## 2015-05-21 ENCOUNTER — Encounter: Payer: Self-pay | Admitting: Internal Medicine

## 2015-05-21 NOTE — Assessment & Plan Note (Addendum)
-  Pt declines pap smear, mammogram, and colonoscopy  -Pt declines  tdap and zoster vaccinations, inquire again at next visit -Repeat CBC at next visit due to Hg 11.2 on 11/29/14

## 2015-05-21 NOTE — Assessment & Plan Note (Addendum)
Assessment: Pt with well-controlled hypertension compliant with three-class (ACEi, diuretic, & CCB) anti-hypertensive therapy who presents with blood pressure of 148/80.   Plan: -BP 148/80 not at goal <140/90 -Increase amlodipine 5 mg daily to 10 mg daily  -Continue HCTZ 25 mg daily and lisinopril 40 mg daily -Last BMP on 12/09/14 with stable CKD stage 3 with Cr 1.31 and GFR 51 and mild hyponatremia 132, repeat at next visit

## 2015-05-21 NOTE — Assessment & Plan Note (Addendum)
Assessment: Pt with last A1c of 9.7 on 12/09/14 compliant with insulin and oral hypoglycemic therapy with no recent symptomatic hypoglycemia who presents with CBG of 162 and significantly improved A1c of 7.8.  Plan:  -A1c 7.8 not at goal <7, continue metformin 1000 mg BID and Novolog (70/30) 15 U daily. Pt congratulated on progress.    -BP 148/80 not at goal <140/90, increase amlodipine 5 mg daily to 10 mg daily and continue HCTZ 25 mg daily and lisinopril 40 mg daily  -Last LDL 82 at goal <100 not on statin therapy, repeat lipid panel at next visit, pt declines starting statin therapy and would like continue dietary modifcation  -Last annual eye exam on 06/10/12 with glaucoma, pt instructed to follow-up with her opthalmologist  -Last annual foot exam on 06/11/14, repeat at next visit -Last annual urine microalbumin test with 94.6 mg of proteinuria on 06/11/14, repeat at next visit -BMI 25.73 near goal <35, continue weight loss

## 2015-05-22 NOTE — Progress Notes (Signed)
Medicine attending: Medical history, presenting problems, physical findings, and medications, reviewed with Dr Marjan Rabbani on the day of the patient visit and I concur with her evaluation and management plan. 

## 2015-06-18 ENCOUNTER — Other Ambulatory Visit: Payer: Self-pay | Admitting: Internal Medicine

## 2015-07-04 ENCOUNTER — Other Ambulatory Visit: Payer: Self-pay | Admitting: Internal Medicine

## 2015-08-17 ENCOUNTER — Other Ambulatory Visit: Payer: Self-pay | Admitting: Internal Medicine

## 2015-08-30 ENCOUNTER — Other Ambulatory Visit: Payer: Self-pay | Admitting: Internal Medicine

## 2015-09-09 ENCOUNTER — Ambulatory Visit (INDEPENDENT_AMBULATORY_CARE_PROVIDER_SITE_OTHER): Payer: Medicare Other | Admitting: Internal Medicine

## 2015-09-09 ENCOUNTER — Encounter: Payer: Self-pay | Admitting: Internal Medicine

## 2015-09-09 ENCOUNTER — Telehealth: Payer: Self-pay | Admitting: Dietician

## 2015-09-09 VITALS — BP 128/76 | HR 77 | Temp 98.1°F | Ht 64.0 in | Wt 144.6 lb

## 2015-09-09 DIAGNOSIS — Z0000 Encounter for general adult medical examination without abnormal findings: Secondary | ICD-10-CM

## 2015-09-09 DIAGNOSIS — Z794 Long term (current) use of insulin: Secondary | ICD-10-CM | POA: Diagnosis not present

## 2015-09-09 DIAGNOSIS — Z7984 Long term (current) use of oral hypoglycemic drugs: Secondary | ICD-10-CM | POA: Diagnosis not present

## 2015-09-09 DIAGNOSIS — I129 Hypertensive chronic kidney disease with stage 1 through stage 4 chronic kidney disease, or unspecified chronic kidney disease: Secondary | ICD-10-CM | POA: Diagnosis not present

## 2015-09-09 DIAGNOSIS — Z23 Encounter for immunization: Secondary | ICD-10-CM

## 2015-09-09 DIAGNOSIS — E1122 Type 2 diabetes mellitus with diabetic chronic kidney disease: Secondary | ICD-10-CM | POA: Diagnosis not present

## 2015-09-09 DIAGNOSIS — N183 Chronic kidney disease, stage 3 unspecified: Secondary | ICD-10-CM

## 2015-09-09 DIAGNOSIS — I10 Essential (primary) hypertension: Secondary | ICD-10-CM

## 2015-09-09 LAB — BASIC METABOLIC PANEL
Anion gap: 6 (ref 5–15)
BUN: 16 mg/dL (ref 6–20)
CHLORIDE: 105 mmol/L (ref 101–111)
CO2: 27 mmol/L (ref 22–32)
CREATININE: 1.28 mg/dL — AB (ref 0.44–1.00)
Calcium: 9.8 mg/dL (ref 8.9–10.3)
GFR calc non Af Amer: 44 mL/min — ABNORMAL LOW (ref >60)
GFR, EST AFRICAN AMERICAN: 51 mL/min — AB (ref >60)
Glucose, Bld: 117 mg/dL — ABNORMAL HIGH (ref 65–99)
POTASSIUM: 4.3 mmol/L (ref 3.5–5.1)
Sodium: 138 mmol/L (ref 135–145)

## 2015-09-09 LAB — POCT GLYCOSYLATED HEMOGLOBIN (HGB A1C): Hemoglobin A1C: 7.5

## 2015-09-09 LAB — GLUCOSE, CAPILLARY: Glucose-Capillary: 127 mg/dL — ABNORMAL HIGH (ref 65–99)

## 2015-09-09 NOTE — Assessment & Plan Note (Signed)
BP Readings from Last 3 Encounters:  09/09/15 128/76  05/20/15 148/80  12/09/14 144/85    Lab Results  Component Value Date   NA 132* 12/09/2014   K 4.3 12/09/2014   CREATININE 1.31* 12/09/2014    Assessment: Blood pressure control:  controlled Progress toward BP goal:   at goal Comments: on amlodipine 10, lisinopril 40, HCTZ 25  Plan: Medications:  continue current medications Educational resources provided: handout Self management tools provided:   Other plans: f/u in 748m

## 2015-09-09 NOTE — Progress Notes (Signed)
   Patient ID: Stephanie Davis female   DOB: Jul 07, 1951 64 y.o.   MRN: [id number]  Subjective:   HPI: Stephanie Davis is a 64 y.o. with PMH of IDT2DM w CKD3, HTN who [facility] today for follow-up of her diabetes and hypertension. She has no complaints at this time.   Please see problem-based charting for status of medical issues pertinent to this visit.  Review of Systems: Pertinent items noted in HPI and remainder of comprehensive ROS otherwise negative.  Objective:  Physical Exam: Filed Vitals:   09/09/15 1353  BP: 128/76  Pulse: 77  Temp: 98.1 F (36.7 C)  TempSrc: Oral  Height: 5\' 4"  (1.626 m)  Weight: 144 lb 9.6 oz (65.59 kg)  SpO2: 100%   Gen: Well-appearing, alert and oriented to person, place, and time HEENT: Oropharynx clear without erythema or exudate.  Neck: No cervical LAD, no thyromegaly or nodules, no JVD noted. CV: Normal rate, regular rhythm, no murmurs, rubs, or gallops Pulmonary: Normal effort, CTA bilaterally, no wheezing, rales, or rhonchi Abdominal: Soft, non-tender, non-distended, without rebound, guarding, or masses Extremities: Distal pulses 2+ in upper and lower extremities bilaterally, no tenderness, erythema or edema Skin: No atypical appearing moles. No rashes  Assessment & Plan:  Please see problem-based charting for assessment and plan.  Reubin MilanBilly Arty Lantzy, MD Resident Physician, PGY-1 Department of Internal Medicine [facility]

## 2015-09-09 NOTE — Progress Notes (Signed)
Medicine attending: I personally interviewed and briefly examined this patient on the day of the patient visit and reviewed pertinent clinical ,laboratory, data  with resident physician Dr. Wonda CeriseWilliam Kennedy and we discussed a management plan.

## 2015-09-09 NOTE — Assessment & Plan Note (Signed)
Lab Results  Component Value Date   HGBA1C 7.5 09/09/2015   HGBA1C 7.8 05/20/2015   HGBA1C 9.7 12/09/2014     Assessment: Diabetes control:   continuing to make progress Progress toward A1C goal:   continuing to improve Comments: on metformin 1000mg  BID and novolog 70/30 15U qhs, no hypoglycemic symptoms  Plan: Medications:  continue current medications Home glucose monitoring: Frequency:   Timing:   Instruction/counseling given: reminded to get eye exam, reminded to bring blood glucose meter & log to each visit, reminded to bring medications to each visit and discussed diet Educational resources provided: handout Self management tools provided: copy of home glucose meter download Other plans: Performed foot exam. Reminded to see her ophthalmologist

## 2015-09-09 NOTE — Telephone Encounter (Signed)
Patient contacted to find out when and where her last eye exam was for her diabetes. She thinks it was in 2015, say she intends to call dr. Darcus AustinBernstorff and schedule an appointment and will et us know when it is.

## 2015-09-09 NOTE — Assessment & Plan Note (Signed)
Received flu shot today. 

## 2015-09-10 LAB — CBC WITH DIFFERENTIAL/PLATELET
Basophils Absolute: 0 10*3/uL (ref 0.0–0.2)
Basos: 0 %
EOS (ABSOLUTE): 0.2 10*3/uL (ref 0.0–0.4)
Eos: 2 %
Hematocrit: 35.3 % (ref 34.0–46.6)
Hemoglobin: 11.2 g/dL (ref 11.1–15.9)
IMMATURE GRANULOCYTES: 0 %
Immature Grans (Abs): 0 10*3/uL (ref 0.0–0.1)
LYMPHS ABS: 3.4 10*3/uL — AB (ref 0.7–3.1)
Lymphs: 41 %
MCH: 26.6 pg (ref 26.6–33.0)
MCHC: 31.7 g/dL (ref 31.5–35.7)
MCV: 84 fL (ref 79–97)
Monocytes Absolute: 0.7 10*3/uL (ref 0.1–0.9)
Monocytes: 8 %
NEUTROS PCT: 49 %
Neutrophils Absolute: 4 10*3/uL (ref 1.4–7.0)
PLATELETS: 330 10*3/uL (ref 150–379)
RBC: 4.21 x10E6/uL (ref 3.77–5.28)
RDW: 13.9 % (ref 12.3–15.4)
WBC: 8.2 10*3/uL (ref 3.4–10.8)

## 2015-09-20 ENCOUNTER — Encounter: Payer: Self-pay | Admitting: Student

## 2015-10-19 LAB — HM DIABETES EYE EXAM

## 2015-11-03 ENCOUNTER — Encounter: Payer: Self-pay | Admitting: *Deleted

## 2015-11-08 ENCOUNTER — Encounter: Payer: Self-pay | Admitting: Internal Medicine

## 2015-11-08 DIAGNOSIS — Z794 Long term (current) use of insulin: Secondary | ICD-10-CM

## 2015-11-08 DIAGNOSIS — E11311 Type 2 diabetes mellitus with unspecified diabetic retinopathy with macular edema: Secondary | ICD-10-CM | POA: Insufficient documentation

## 2015-12-28 ENCOUNTER — Encounter: Payer: Self-pay | Admitting: *Deleted

## 2016-02-17 ENCOUNTER — Encounter: Payer: Medicare Other | Admitting: Internal Medicine

## 2016-03-02 ENCOUNTER — Encounter: Payer: Self-pay | Admitting: Internal Medicine

## 2016-03-02 ENCOUNTER — Ambulatory Visit (INDEPENDENT_AMBULATORY_CARE_PROVIDER_SITE_OTHER): Payer: Medicare Other | Admitting: Internal Medicine

## 2016-03-02 VITALS — BP 134/71 | HR 65 | Temp 98.5°F | Wt 143.7 lb

## 2016-03-02 DIAGNOSIS — N183 Chronic kidney disease, stage 3 unspecified: Secondary | ICD-10-CM

## 2016-03-02 DIAGNOSIS — E1165 Type 2 diabetes mellitus with hyperglycemia: Secondary | ICD-10-CM

## 2016-03-02 DIAGNOSIS — Z794 Long term (current) use of insulin: Secondary | ICD-10-CM | POA: Diagnosis not present

## 2016-03-02 DIAGNOSIS — I129 Hypertensive chronic kidney disease with stage 1 through stage 4 chronic kidney disease, or unspecified chronic kidney disease: Secondary | ICD-10-CM | POA: Diagnosis not present

## 2016-03-02 DIAGNOSIS — Z79899 Other long term (current) drug therapy: Secondary | ICD-10-CM

## 2016-03-02 DIAGNOSIS — E1142 Type 2 diabetes mellitus with diabetic polyneuropathy: Secondary | ICD-10-CM

## 2016-03-02 DIAGNOSIS — E1122 Type 2 diabetes mellitus with diabetic chronic kidney disease: Secondary | ICD-10-CM | POA: Diagnosis not present

## 2016-03-02 DIAGNOSIS — Z0000 Encounter for general adult medical examination without abnormal findings: Secondary | ICD-10-CM

## 2016-03-02 DIAGNOSIS — I10 Essential (primary) hypertension: Secondary | ICD-10-CM

## 2016-03-02 LAB — GLUCOSE, CAPILLARY: Glucose-Capillary: 133 mg/dL — ABNORMAL HIGH (ref 65–99)

## 2016-03-02 LAB — POCT GLYCOSYLATED HEMOGLOBIN (HGB A1C): Hemoglobin A1C: 8.3

## 2016-03-02 MED ORDER — INSULIN ASPART PROT & ASPART (70-30 MIX) 100 UNIT/ML PEN: PEN_INJECTOR | SUBCUTANEOUS | Status: DC

## 2016-03-02 NOTE — Patient Instructions (Addendum)
-  Your A1c increased from 8.3 to 7.5 from last time, please increase your insulin from 15 U daily to 18 U daily so we can get you at goal of less than 7.  -Will check your bloodwork next time -Please think about mammogram and colonoscopy  -Very nice seeing you, please come back in 3 months  General Instructions:   Thank you for bringing your medicines today. This helps us keep you safe from mistakes.   Progress Toward Treatment Goals:  Treatment Goal 05/15/2013  Hemoglobin A1C unchanged  Blood pressure at goal    Self Care Goals & Plans:  Self Care Goal 09/09/2015  Manage my medications take my medicines as prescribed; bring my medications to every visit; refill my medications on time  Monitor my health check my feet daily  Eat healthy foods drink diet soda or water instead of juice or soda; eat more vegetables; eat foods that are low in salt; eat baked foods instead of fried foods  Be physically active take a walk every day    Home Blood Glucose Monitoring 05/15/2013  Check my blood sugar 3 times a day     Care Management & Community Referrals:  Referral 05/15/2013  Referrals made for care management support nutritionist

## 2016-03-02 NOTE — Assessment & Plan Note (Signed)
-  Pt declined screening mammogram, colonoscopy, and pap smear, inquire again at next visit  -Pt declined zoster and tdap vaccinations, inquire again at next visit  -Obtain CMP, CBC, and HCV Ab at next visit (declined today)

## 2016-03-02 NOTE — Assessment & Plan Note (Signed)
Assessment: Pt with well-controlled hypertension compliant with three-class (ACEi, diuretic, & CCB) anti-hypertensive therapy who presents with blood pressure of 134/71.   Plan:  -BP 134/71 at goal <140/90 -Continue amlodipine 10 mg daily, HCTZ 25 mg daily, and lisinopril 40 mg daily

## 2016-03-02 NOTE — Progress Notes (Signed)
Patient ID: Stephanie Davis, female   DOB: [birth year], 65 y.o.   MRN: [id number]    Subjective:   Patient ID: Stephanie Davis female   DOB: Mar 26, 1951 65 y.o.   MRN: [id number]  HPI: Stephanie Davis is a 49 y.o.  very pleasant woman with past medical history of insulin-dependent Type II DM, CKD Stage 3, hypertension, hyperlipidemia, and allergic rhinitis who presents for follow-up visit of diabetes.   Her last A1c was 7.5 on 09/09/15. She reports compliance with taking metformin 1000 mg BID and Novolog (70/30) 15 U daily. She checks her blood glucose 1-2 times daily and brought her meter today which reveals range of 70-314 with average of 154. She denies symptomatic hypoglycemia. She has chronic peripheral neuropathy but denies blurry vision, polydipsia, polyphagia, polyuria, or foot injury/ulceration. She tries to follow a healthy diet but continues to eat ice cream. She exercises regularly. She has lost 2 lb since last visit 10 months ago.    She reports compliance with taking amlodipine, HCTZ, and lisinopril for hypertension. She denies headache,  lightheadedness, chest pain or LE edema.   She declines starting statin therapy and wants to continue diet modification.   She declines mammogram, pap smear, colonoscopy, tdap, and shingles vaccination.    Past Medical History  Diagnosis Date  . Hypertension   . Diabetes mellitus   . Hyperparathyroidism   . History of depression   . Hyperlipidemia   . CKD (chronic kidney disease), stage III    Current Outpatient Prescriptions  Medication Sig Dispense Refill  . amLODipine (NORVASC) 10 MG tablet Take 1 tablet (10 mg total) by mouth daily. 90 tablet 3  . aspirin (ADULT ASPIRIN EC LOW STRENGTH) 81 MG EC tablet Take 81 mg by mouth daily.      . B-D UF III MINI PEN NEEDLES 31G X 5 MM MISC USE TO INJECT INSULIN ONCE DAILY 100 each 11  . Butenafine HCl (LOTRIMIN ULTRA) 1 % cream Apply topically 2 (two) times daily.      .  hydrochlorothiazide (HYDRODIURIL) 25 MG tablet TAKE ONE TABLET BY MOUTH ONCE DAILY IN THE MORNING 90 tablet 3  . insulin aspart protamine - aspart (NOVOLOG MIX 70/30 FLEXPEN) (70-30) 100 UNIT/ML FlexPen Inject 15 units once daily 15 mL 12  . Insulin Syringe-Needle U-100 (INSULIN SYRINGE .5CC/30GX5/16") 30G X 5/16" 0.5 ML MISC Use to inject insulin once daily dx 250.00 100 each 11  . lisinopril (PRINIVIL,ZESTRIL) 40 MG tablet TAKE ONE TABLET BY MOUTH ONCE DAILY 90 tablet 3  . metFORMIN (GLUCOPHAGE) 1000 MG tablet TAKE ONE TABLET BY MOUTH TWICE DAILY WITH  A  MEAL 180 tablet 3  . Multiple Vitamins-Minerals (MULTIVITAL PERFORMANCE PO) Take 1 tablet by mouth daily.       No current facility-administered medications for this visit.   Family History  Problem Relation Age of Onset  . Hypertension Mother   . Heart disease Father 46    myocardial infarction  . Hypertension Father   . Diabetes Sister   . Diabetes Brother    Social History   Social History  . Marital Status: Married    Spouse Name: N/A  . Number of Children: 4  . Years of Education: 2 y colleg   Occupational History  . 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 Smoking status: Former Games developer  . Smokeless tobacco: Not on file  . Alcohol Use: No  . Drug Use: No  .  Sexual Activity: Not on file   Other Topics Concern  . Not on file   Social History Narrative   Married since 36 years.   4 kids with 1 daughter passed from sepsis.   Cell phone: [phone]            Review of Systems: Review of Systems  Constitutional: Positive for weight loss. Negative for fever and chills.  Eyes: Negative for blurred vision.  Respiratory: Positive for shortness of breath (with exertion). Negative for cough and wheezing.   Cardiovascular: Negative for chest pain and leg swelling.  Gastrointestinal: Negative for nausea, vomiting, abdominal pain, diarrhea and constipation.  Genitourinary: Negative for dysuria,  urgency, frequency and hematuria.  Neurological: Positive for sensory change (chronic peripheral neuropathy). Negative for dizziness and headaches.  Endo/Heme/Allergies: Negative for polydipsia.    Objective:  Physical Exam: Filed Vitals:   03/02/16 1332  BP: 134/71  Pulse: 65  Temp: 98.5 F (36.9 C)  Weight: 143 lb 11.2 oz (65.182 kg)  SpO2: 100%    Physical Exam  Constitutional: She is oriented to person, place, and time. She appears well-developed and well-nourished. No distress.  HENT:  Head: Normocephalic and atraumatic.  Right Ear: External ear normal.  Left Ear: External ear normal.  Nose: Nose normal.  Mouth/Throat: Oropharynx is clear and moist. No oropharyngeal exudate.  Eyes: Conjunctivae and EOM are normal. Pupils are equal, round, and reactive to light. Right eye exhibits no discharge. Left eye exhibits no discharge. No scleral icterus.  Neck: Normal range of motion. Neck supple.  Cardiovascular: Normal rate, regular rhythm and normal heart sounds.   Pulmonary/Chest: Effort normal and breath sounds normal. No respiratory distress. She has no wheezes. She has no rales.  Abdominal: Soft. Bowel sounds are normal. She exhibits no distension. There is no tenderness. There is no rebound and no guarding.  Musculoskeletal: Normal range of motion. She exhibits no edema or tenderness.  Neurological: She is alert and oriented to person, place, and time.  Skin: Skin is warm and dry. No rash noted. She is not diaphoretic. No erythema. No pallor.  Psychiatric: She has a normal mood and affect. Her behavior is normal. Judgment and thought content normal.    Assessment & Plan:   Please see problem list for problem-based assessment and plan

## 2016-03-02 NOTE — Assessment & Plan Note (Addendum)
Assessment: Pt with last A1c of 7.5 on 09/09/15 compliant with insulin and oral hypoglycemic therapy with no recent symptomatic hypoglycemia who presents with CBG of 133 and mildly worsened A1c of 8.3.  Plan:  -A1c 8.3 not at goal <7, continue metformin 1000 mg BID and increase Novolog 70/30 mix from 15 U daily to 18 U daily with instructions to decrease if CBG<70 . Will refer to Diabetes Coordinator for nutritional consultation.  -BP 134/71 at goal <140/90, continue amlodipine 10 mg daily, HCTZ 25 mg daily, and lisinopril 40 mg daily -Obtain annual lipid panel at next visit (declined today), last LDL 82 at goal <100 not on statin therapy, pt declines starting statin or alternative anti-lipid lowering therapy  -Last annual eye exam on 10/19/15 with retinopathy  -Last annual foot exam on 09/09/15 -Obtain annual urine microalbumin test -BMI 24.65 at goal <30, continue lifestyle modification

## 2016-03-03 LAB — MICROALBUMIN / CREATININE URINE RATIO
Creatinine, Urine: 25.2 mg/dL
MICROALB/CREAT RATIO: 346.4 mg/g{creat} — AB (ref 0.0–30.0)
Microalbumin, Urine: 87.3 ug/mL

## 2016-03-07 NOTE — Progress Notes (Signed)
Case discussed with Dr. Rabbani soon after the resident saw the patient.  We reviewed the resident's history and exam and pertinent patient test results.  I agree with the assessment, diagnosis and plan of care documented in the resident's note. 

## 2016-03-22 ENCOUNTER — Other Ambulatory Visit: Payer: Self-pay | Admitting: Dietician

## 2016-03-22 DIAGNOSIS — Z794 Long term (current) use of insulin: Principal | ICD-10-CM

## 2016-03-22 DIAGNOSIS — E1122 Type 2 diabetes mellitus with diabetic chronic kidney disease: Secondary | ICD-10-CM

## 2016-03-22 DIAGNOSIS — N183 Chronic kidney disease, stage 3 unspecified: Secondary | ICD-10-CM

## 2016-03-22 MED ORDER — LANCETS 30G MISC: Status: DC

## 2016-03-22 MED ORDER — GLUCOSE BLOOD VI STRP: ORAL_STRIP | Status: DC

## 2016-03-22 NOTE — Telephone Encounter (Signed)
Called patient to find out what meter she uses.  She uses the [employer] meter and would like a prescription for the supplies.

## 2016-03-30 ENCOUNTER — Ambulatory Visit: Payer: Medicare Other | Admitting: Dietician

## 2016-04-03 ENCOUNTER — Telehealth: Payer: Self-pay | Admitting: *Deleted

## 2016-04-04 NOTE — Telephone Encounter (Signed)
Phone note opened in error when trying to update referral follow up info. No phone call made. Dorie RankSharon Cherith Tewell, RN, 04/04/16, 3P

## 2016-04-24 ENCOUNTER — Encounter: Payer: Self-pay | Admitting: *Deleted

## 2016-04-30 NOTE — Addendum Note (Signed)
Addended by: Neomia DearPOWERS, Lamya Lausch E on: 04/30/2016 03:54 PM   Modules accepted: Orders

## 2016-06-06 ENCOUNTER — Other Ambulatory Visit: Payer: Self-pay | Admitting: Internal Medicine

## 2016-06-06 DIAGNOSIS — I10 Essential (primary) hypertension: Secondary | ICD-10-CM

## 2016-06-06 NOTE — Telephone Encounter (Signed)
I provided Ms. Gramling with a refill on her Amlodipine 10mg , to take one tablet once a day.   Please have her make an appointment with me, her new PCP, in December for her regular follow-up visit.  Patient is free to contact me with any questions or concerns in the meantime.

## 2016-06-15 ENCOUNTER — Other Ambulatory Visit: Payer: Self-pay | Admitting: Internal Medicine

## 2016-06-15 DIAGNOSIS — N183 Chronic kidney disease, stage 3 (moderate): Principal | ICD-10-CM

## 2016-06-15 DIAGNOSIS — E1122 Type 2 diabetes mellitus with diabetic chronic kidney disease: Secondary | ICD-10-CM

## 2016-06-21 ENCOUNTER — Other Ambulatory Visit: Payer: Self-pay | Admitting: Internal Medicine

## 2016-06-21 DIAGNOSIS — I10 Essential (primary) hypertension: Secondary | ICD-10-CM

## 2016-06-22 ENCOUNTER — Other Ambulatory Visit: Payer: Self-pay | Admitting: Internal Medicine

## 2016-06-22 ENCOUNTER — Other Ambulatory Visit: Payer: Self-pay

## 2016-06-22 DIAGNOSIS — N183 Chronic kidney disease, stage 3 unspecified: Secondary | ICD-10-CM

## 2016-06-22 DIAGNOSIS — E1122 Type 2 diabetes mellitus with diabetic chronic kidney disease: Secondary | ICD-10-CM

## 2016-06-22 NOTE — Telephone Encounter (Signed)
[facility] pharmacy called - has refills on Novolog 70/30 flexpen; verbal order given.

## 2016-06-22 NOTE — Telephone Encounter (Signed)
Requesting Novolog mix 70/30/ flexpen to be filled.

## 2016-07-04 ENCOUNTER — Other Ambulatory Visit: Payer: Self-pay | Admitting: Internal Medicine

## 2016-08-16 ENCOUNTER — Other Ambulatory Visit: Payer: Self-pay | Admitting: Internal Medicine

## 2016-08-16 NOTE — Telephone Encounter (Signed)
appt 10/03/16 with pcp

## 2016-08-20 ENCOUNTER — Ambulatory Visit (INDEPENDENT_AMBULATORY_CARE_PROVIDER_SITE_OTHER): Payer: Medicare Other | Admitting: *Deleted

## 2016-08-20 DIAGNOSIS — Z23 Encounter for immunization: Secondary | ICD-10-CM | POA: Diagnosis not present

## 2016-09-06 ENCOUNTER — Other Ambulatory Visit: Payer: Self-pay | Admitting: Internal Medicine

## 2016-10-02 ENCOUNTER — Telehealth: Payer: Self-pay | Admitting: Internal Medicine

## 2016-10-02 NOTE — Telephone Encounter (Signed)
APT. REMINDER CALL, LMTCB °

## 2016-10-03 ENCOUNTER — Ambulatory Visit (INDEPENDENT_AMBULATORY_CARE_PROVIDER_SITE_OTHER): Payer: Medicare Other | Admitting: Internal Medicine

## 2016-10-03 ENCOUNTER — Encounter: Payer: Self-pay | Admitting: Internal Medicine

## 2016-10-03 ENCOUNTER — Encounter (INDEPENDENT_AMBULATORY_CARE_PROVIDER_SITE_OTHER): Payer: Self-pay

## 2016-10-03 VITALS — BP 133/70 | HR 67 | Temp 98.1°F | Ht 64.0 in | Wt 145.3 lb

## 2016-10-03 DIAGNOSIS — I129 Hypertensive chronic kidney disease with stage 1 through stage 4 chronic kidney disease, or unspecified chronic kidney disease: Secondary | ICD-10-CM | POA: Diagnosis not present

## 2016-10-03 DIAGNOSIS — Z87891 Personal history of nicotine dependence: Secondary | ICD-10-CM

## 2016-10-03 DIAGNOSIS — E1165 Type 2 diabetes mellitus with hyperglycemia: Secondary | ICD-10-CM

## 2016-10-03 DIAGNOSIS — Z0000 Encounter for general adult medical examination without abnormal findings: Secondary | ICD-10-CM

## 2016-10-03 DIAGNOSIS — E1122 Type 2 diabetes mellitus with diabetic chronic kidney disease: Secondary | ICD-10-CM | POA: Diagnosis not present

## 2016-10-03 DIAGNOSIS — E781 Pure hyperglyceridemia: Secondary | ICD-10-CM

## 2016-10-03 DIAGNOSIS — Z794 Long term (current) use of insulin: Secondary | ICD-10-CM

## 2016-10-03 DIAGNOSIS — N183 Chronic kidney disease, stage 3 unspecified: Secondary | ICD-10-CM

## 2016-10-03 DIAGNOSIS — I10 Essential (primary) hypertension: Secondary | ICD-10-CM

## 2016-10-03 DIAGNOSIS — E785 Hyperlipidemia, unspecified: Secondary | ICD-10-CM

## 2016-10-03 DIAGNOSIS — Z7289 Other problems related to lifestyle: Secondary | ICD-10-CM

## 2016-10-03 DIAGNOSIS — Z79899 Other long term (current) drug therapy: Secondary | ICD-10-CM

## 2016-10-03 LAB — GLUCOSE, CAPILLARY: GLUCOSE-CAPILLARY: 122 mg/dL — AB (ref 65–99)

## 2016-10-03 LAB — POCT GLYCOSYLATED HEMOGLOBIN (HGB A1C): HEMOGLOBIN A1C: 8.1

## 2016-10-03 MED ORDER — LISINOPRIL 40 MG PO TABS: 40.0000 mg | ORAL_TABLET | Freq: Every day | ORAL | 3 refills | Status: DC

## 2016-10-03 NOTE — Patient Instructions (Signed)
Your A1c was 8.1 today. Please increase your insulin to 18U nightly and keep taking your metformin  1000mg  twice a day.  I will send in for your mammogram.  I will call you with the results of your blood work.  Please consider screening exams like the pap smear, colonoscopy, and your shots for the next visit.   It was nice meeting you!

## 2016-10-03 NOTE — Progress Notes (Signed)
   CC: Diabetes  HPI:  [name] is a 65 y.o. with a PMH of insulin dependent T2DM, CKD stage 3, HLD, HTN presenting for follow up on those conditions.  T2DM: Patient's last A1c in 02/2016 was 8.3. At that time her Novolog 70/30 mix was increased to 18U daily but patient had only increased to 16U daily. Patient denies polyuria, polydipsia, and hypoglycemic episodes. She states that her AM CBG readings are elevated when she has a nighttime snack of ice cream which is several days per week.   HTN: Patient is being managed with amlodipine 10mg  daily, HCTZ 25mg  daily, and lisinopril 40mg  daily; she denies chest pain, shortness of breath, headaches, vision or hearing changes.  HLD: Patient has declined statin therapy in the past favoring diet modification. Last LDL was 82 in 05/2014.   Health maintenance:  Needs ophtho exam, foot exam, CMP, CBC, HCV ab screen, and lipid panel; pap smear, colonoscopy, tdap, mammogram and shingles vaccine (has declined these in past visits.)  Please see problem based Assessment and Plan for status of patients chronic conditions.  Past Medical History:  Diagnosis Date  . CKD (chronic kidney disease), stage III   . Diabetes mellitus   . History of depression   . Hyperlipidemia   . Hyperparathyroidism   . Hypertension     Review of Systems:   Review of Systems  Constitutional: Negative for chills, diaphoresis, fever and weight loss.  HENT: Negative for hearing loss.   Eyes: Negative for blurred vision, double vision, pain and discharge.  Respiratory: Negative for cough, hemoptysis, sputum production, shortness of breath and wheezing.   Cardiovascular: Negative for chest pain, palpitations, orthopnea and leg swelling.  Gastrointestinal: Negative for abdominal pain, blood in stool, constipation, diarrhea, heartburn, melena, nausea and vomiting.  Genitourinary: Negative for dysuria, flank pain, frequency, hematuria and urgency.  Musculoskeletal:  Negative for myalgias.  Skin: Negative for rash.  Neurological: Negative for dizziness, tingling, tremors, sensory change, focal weakness, weakness and headaches.  Endo/Heme/Allergies: Negative for polydipsia.  Psychiatric/Behavioral: Negative for depression. The patient is not nervous/anxious.     Physical Exam:  Vitals:   10/03/16 1342  BP: 133/70  Pulse: 67  Temp: 98.1 F (36.7 C)  TempSrc: Oral  SpO2: 100%  Weight: 145 lb 4.8 oz (65.9 kg)  Height: 5\' 4"  (1.626 m)   Physical Exam Constitutional: NAD, pleasant, VS reviewed CV: RRR, no murmurs, rubs or gallops appreciated, no LE edema, pulses intact Resp: CTAB, no increased work of breathing, no wheezing or crackles Abd: soft, +BS, NDNT MSK: strength and sensation intact Skin: scarring of bilateral LE without evidence of current abrasions or lesions  Assessment & Plan:   See Encounters Tab for problem based charting.   Patient seen with Dr. Argentina PonderGranfortuna   Kadija Cruzen, MD Internal Medicine PGY1

## 2016-10-04 LAB — CBC
Hematocrit: 38.1 % (ref 34.0–46.6)
Hemoglobin: 12.3 g/dL (ref 11.1–15.9)
MCH: 26.9 pg (ref 26.6–33.0)
MCHC: 32.3 g/dL (ref 31.5–35.7)
MCV: 83 fL (ref 79–97)
Platelets: 340 10*3/uL (ref 150–379)
RBC: 4.57 x10E6/uL (ref 3.77–5.28)
RDW: 14.4 % (ref 12.3–15.4)
WBC: 7.7 10*3/uL (ref 3.4–10.8)

## 2016-10-04 LAB — CMP14 + ANION GAP
ALK PHOS: 107 IU/L (ref 39–117)
ALT: 23 IU/L (ref 0–32)
AST: 19 IU/L (ref 0–40)
Albumin/Globulin Ratio: 1.5 (ref 1.2–2.2)
Albumin: 5 g/dL — ABNORMAL HIGH (ref 3.6–4.8)
Anion Gap: 23 mmol/L — ABNORMAL HIGH (ref 10.0–18.0)
BUN/Creatinine Ratio: 15 (ref 12–28)
BUN: 21 mg/dL (ref 8–27)
CHLORIDE: 99 mmol/L (ref 96–106)
CO2: 20 mmol/L (ref 18–29)
Calcium: 10.8 mg/dL — ABNORMAL HIGH (ref 8.7–10.3)
Creatinine, Ser: 1.41 mg/dL — ABNORMAL HIGH (ref 0.57–1.00)
GFR calc Af Amer: 46 mL/min/{1.73_m2} — ABNORMAL LOW (ref >59)
GFR calc non Af Amer: 40 mL/min/{1.73_m2} — ABNORMAL LOW (ref >59)
GLUCOSE: 152 mg/dL — AB (ref 65–99)
Globulin, Total: 3.3 g/dL (ref 1.5–4.5)
Potassium: 4.8 mmol/L (ref 3.5–5.2)
Sodium: 142 mmol/L (ref 134–144)
TOTAL PROTEIN: 8.3 g/dL (ref 6.0–8.5)

## 2016-10-04 LAB — LIPID PANEL
CHOL/HDL RATIO: 3.4 ratio (ref 0.0–4.4)
Cholesterol, Total: 189 mg/dL (ref 100–199)
HDL: 55 mg/dL (ref >39)
LDL CALC: 102 mg/dL — AB (ref 0–99)
TRIGLYCERIDES: 161 mg/dL — AB (ref 0–149)
VLDL Cholesterol Cal: 32 mg/dL (ref 5–40)

## 2016-10-04 LAB — HEPATITIS C ANTIBODY: HEP C VIRUS AB: 0.5 {s_co_ratio} (ref 0.0–0.9)

## 2016-10-05 NOTE — Assessment & Plan Note (Signed)
-  Patient received stool cards today but declines colonoscopy -Patient screened for HCV - negative -Patient received foot exam, will set up ophtho exam with her eye doctor - inquire about at next visit -Patient declined flu and pneumonia vaccinations at this time -Patient declined tDap and shingles vaccinations at this time -Patient declined pap smear at this visit - will address next time

## 2016-10-05 NOTE — Assessment & Plan Note (Addendum)
Patient with last A1c of 8.3 on 02/2016 managed with novolog mix 70/30 and metformin 1000mg  BID. Patient was instructed at last visit to increase insulin from 15U daily to 18U daily but only increased to 16. She denies hypo or hyperglycemic episodes. She indulges in ice cream almost every night as a snack and has resultant elevated AM CBG's.   Plan: --check A1c - 8.1 - stable; patient agrees to increase novolog mix 70/30 to 18U daily; continue metformin 1000mg  BID --foot exam done today --patient encouraged to make annual ophtho appointment as last one was 09/2015.  --check CBC, CMP - stage 3 CKD stable, no anemia, mildly elevated calcium but corrected to normal with respect to albumin

## 2016-10-05 NOTE — Progress Notes (Signed)
Medicine attending: I personally interviewed and briefly examined this patient on the day of the patient visit and reviewed pertinent clinical ,laboratory, and radiographic data  with resident physician Dr. Gorica Svalina and we discussed a management plan. 

## 2016-10-05 NOTE — Assessment & Plan Note (Signed)
Patient well controlled with amlodipine 10mg  daily, HCTZ 25mg  daily, and lisinopril 40mg  daily. BP within goal this visit. Patient without symptoms of hyper or hypotension.  Plan: --continue current regimen of amlodipine 10mg  daily, HCTZ 25mg  daily, and lisinopril 40mg  daily

## 2016-10-05 NOTE — Assessment & Plan Note (Signed)
Patient with history of mild hypertriglyceridemia and high ASCVD risk score. Last LDL was 82 in 05/2014.   Plan: --check lipid panel - triglycerides stable at 160, LDL 102; will discuss with patient about starting statin therapy next visit as she wants to continue working on diet changes.  --discussed decreasing snacks and ice cream; patient in agreement to try and limit these foods.

## 2016-10-10 ENCOUNTER — Telehealth: Payer: Self-pay | Admitting: Internal Medicine

## 2016-10-10 ENCOUNTER — Encounter: Payer: Self-pay | Admitting: Internal Medicine

## 2016-10-10 NOTE — Telephone Encounter (Signed)
Attempted calling patient about test results but could not be reached. I have sent her a letter with results and asked her to set up a follow up appointment in 3-4 months (had not been made after her visit). Results discussed in visit note from 10/03/2016.  Nyra MarketGorica Babbie Dondlinger, MD IMTS - PGY1 Pager [phone]

## 2016-11-26 ENCOUNTER — Other Ambulatory Visit: Payer: Self-pay | Admitting: Internal Medicine

## 2016-11-26 DIAGNOSIS — Z1231 Encounter for screening mammogram for malignant neoplasm of breast: Secondary | ICD-10-CM
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
## 2017-04-03 ENCOUNTER — Ambulatory Visit (INDEPENDENT_AMBULATORY_CARE_PROVIDER_SITE_OTHER): Payer: PPO | Admitting: Internal Medicine

## 2017-04-03 VITALS — BP 130/69 | HR 66 | Temp 98.2°F | Ht 64.0 in | Wt 146.6 lb

## 2017-04-03 DIAGNOSIS — I129 Hypertensive chronic kidney disease with stage 1 through stage 4 chronic kidney disease, or unspecified chronic kidney disease: Secondary | ICD-10-CM | POA: Diagnosis not present

## 2017-04-03 DIAGNOSIS — E782 Mixed hyperlipidemia: Secondary | ICD-10-CM

## 2017-04-03 DIAGNOSIS — Z0000 Encounter for general adult medical examination without abnormal findings: Secondary | ICD-10-CM

## 2017-04-03 DIAGNOSIS — E1122 Type 2 diabetes mellitus with diabetic chronic kidney disease: Secondary | ICD-10-CM | POA: Diagnosis not present

## 2017-04-03 DIAGNOSIS — Z794 Long term (current) use of insulin: Secondary | ICD-10-CM

## 2017-04-03 DIAGNOSIS — E785 Hyperlipidemia, unspecified: Secondary | ICD-10-CM

## 2017-04-03 DIAGNOSIS — Z87891 Personal history of nicotine dependence: Secondary | ICD-10-CM

## 2017-04-03 DIAGNOSIS — I10 Essential (primary) hypertension: Secondary | ICD-10-CM

## 2017-04-03 DIAGNOSIS — Z79899 Other long term (current) drug therapy: Secondary | ICD-10-CM

## 2017-04-03 DIAGNOSIS — N183 Chronic kidney disease, stage 3 unspecified: Secondary | ICD-10-CM

## 2017-04-03 LAB — POCT GLYCOSYLATED HEMOGLOBIN (HGB A1C): Hemoglobin A1C: 7.6

## 2017-04-03 LAB — GLUCOSE, CAPILLARY: Glucose-Capillary: 126 mg/dL — ABNORMAL HIGH (ref 65–99)

## 2017-04-03 MED ORDER — DILTIAZEM HCL ER COATED BEADS 120 MG PO CP24: 120.0000 mg | ORAL_CAPSULE | Freq: Every day | ORAL | 2 refills | Status: DC

## 2017-04-03 NOTE — Patient Instructions (Addendum)
For your blood pressure: STOP taking amlodipine  START taking cardizem 120mg  once a day Continue your HCTZ and lisinopril  For your diabetes:  Continue taking Novolin 18 units twice a day Continue taking metformin 1000mg  twice a day Keep doing a great job on checking your sugars regularly Work on decreasing fried foods, sugary snacks Walk at least 30 minutes 4-5 times a day  Please get your mammogram done and get your vision checked by your next visit.  Please make an appointment in about 1 month to check your blood pressure to make sure the new medicine dose is doing well.  Please make an appointment with me in about 6 months to check back in with your diabetes and cholesterol.

## 2017-04-04 LAB — BMP8+ANION GAP
Anion Gap: 19 mmol/L — ABNORMAL HIGH (ref 10.0–18.0)
BUN / CREAT RATIO: 20 (ref 12–28)
BUN: 26 mg/dL (ref 8–27)
CO2: 20 mmol/L (ref 18–29)
CREATININE: 1.31 mg/dL — AB (ref 0.57–1.00)
Calcium: 9.9 mg/dL (ref 8.7–10.3)
Chloride: 100 mmol/L (ref 96–106)
GFR, EST AFRICAN AMERICAN: 50 mL/min/{1.73_m2} — AB (ref >59)
GFR, EST NON AFRICAN AMERICAN: 43 mL/min/{1.73_m2} — AB (ref >59)
Glucose: 114 mg/dL — ABNORMAL HIGH (ref 65–99)
POTASSIUM: 4.2 mmol/L (ref 3.5–5.2)
SODIUM: 139 mmol/L (ref 134–144)

## 2017-04-04 LAB — MICROALBUMIN / CREATININE URINE RATIO
Creatinine, Urine: 37.9 mg/dL
Microalb/Creat Ratio: 264.1 mg/g creat — ABNORMAL HIGH (ref 0.0–30.0)
Microalbumin, Urine: 100.1 ug/mL

## 2017-04-05 ENCOUNTER — Encounter: Payer: Self-pay | Admitting: Internal Medicine

## 2017-04-05 NOTE — Assessment & Plan Note (Signed)
Patient declines screening colonoscopy or stool cards; she states she will think about it and we will discuss again at next visit.   Mammogram appointment made for pt.  Plan on pap smear at next visit.

## 2017-04-05 NOTE — Assessment & Plan Note (Signed)
Patient is being managed with amlodipine 10mg  daily, HCTZ 25mg  daily, and lisinopril 40mg  daily. She has been well controlled for some time now. She denies chest pain, shortness of breath, headaches, vision or hearing changes.  BP stable today as has been previously. In setting of her CKD and microalbuminuria, will stop amlodipine and begin diltiazem.  Plan: --Stop amlodipine --add diltiazem 120mg  daily - f/u in one month for BP check to assess need for dosage change --continue lisinopril 40mg  daily and HCTZ 25mg  daily

## 2017-04-05 NOTE — Assessment & Plan Note (Addendum)
A1c improved to 7.6. Reviewing her CBG log, she is overall well controlled except for spikes in the afternoons which are correlated to eating junk food. Patient is aware of these indiscretions and is willing to work at improving her habits; she states she is encouraged by her lower A1c this visit.   Plan: --continue metformin 1000mg  BID and Novolin 70/30 mix 18 units BID --patient will work on diet changes --patient states she will make ophtho appointment before next visit --urine microalb/cr - 264 which is slightly improved from prior; f/u in 1 year --Bmet - Cr 1.3, GFR 50; Calcium 9.9 - stable --f/u in 6 months

## 2017-04-05 NOTE — Assessment & Plan Note (Signed)
Patient has declined statin therapy in the past favoring diet modification (decreasing snacks and ice cream). LDL 102 in Dec 2017. ASCVD risk: 21%.  Patient would like to work on her diet but is in agreement to starting statin at next visit if still elevated and with elevated risk.  Plan: --plan on starting statin at next visit

## 2017-04-08 NOTE — Progress Notes (Signed)
Internal Medicine Clinic Attending  Case discussed with Dr. Svalina  at the time of the visit.  We reviewed the resident's history and exam and pertinent patient test results.  I agree with the assessment, diagnosis, and plan of care documented in the resident's note.  

## 2017-04-17 ENCOUNTER — Ambulatory Visit: Payer: PPO

## 2017-04-30 ENCOUNTER — Telehealth: Payer: Self-pay | Admitting: *Deleted

## 2017-04-30 NOTE — Telephone Encounter (Signed)
Follow up call made to patient after she was a no show for her mammo and dexa scheduled for June 20th.  Pt states she was not aware that the appt was on her check out papers and had to cancel the appts.  I offered to rescheduled mammo/dexa, but pt stated she will do that herself.  No further action needed, phone call complete.Criss AlvineGoldston, Darlene Cassady7/3/20182:08 PM

## 2017-05-27 ENCOUNTER — Ambulatory Visit: Payer: PPO

## 2017-06-18 ENCOUNTER — Other Ambulatory Visit: Payer: Self-pay | Admitting: Internal Medicine

## 2017-06-18 DIAGNOSIS — I10 Essential (primary) hypertension: Secondary | ICD-10-CM

## 2017-06-19 NOTE — Telephone Encounter (Signed)
Message sent to front office to schedule an appt. 

## 2017-07-01 ENCOUNTER — Other Ambulatory Visit: Payer: Self-pay | Admitting: Internal Medicine

## 2017-07-01 DIAGNOSIS — I10 Essential (primary) hypertension: Secondary | ICD-10-CM

## 2017-07-01 DIAGNOSIS — N183 Chronic kidney disease, stage 3 unspecified: Secondary | ICD-10-CM

## 2017-07-01 DIAGNOSIS — Z794 Long term (current) use of insulin: Principal | ICD-10-CM

## 2017-07-01 DIAGNOSIS — E1122 Type 2 diabetes mellitus with diabetic chronic kidney disease: Secondary | ICD-10-CM

## 2017-07-10 ENCOUNTER — Other Ambulatory Visit: Payer: Self-pay | Admitting: Internal Medicine

## 2017-07-10 ENCOUNTER — Ambulatory Visit (INDEPENDENT_AMBULATORY_CARE_PROVIDER_SITE_OTHER): Payer: PPO | Admitting: Internal Medicine

## 2017-07-10 VITALS — BP 144/74 | HR 77 | Temp 99.0°F | Ht 64.0 in | Wt 149.4 lb

## 2017-07-10 DIAGNOSIS — E1122 Type 2 diabetes mellitus with diabetic chronic kidney disease: Secondary | ICD-10-CM | POA: Diagnosis not present

## 2017-07-10 DIAGNOSIS — Z23 Encounter for immunization: Secondary | ICD-10-CM

## 2017-07-10 DIAGNOSIS — I10 Essential (primary) hypertension: Secondary | ICD-10-CM

## 2017-07-10 DIAGNOSIS — X088XXS Exposure to other specified smoke, fire and flames, sequela: Secondary | ICD-10-CM | POA: Diagnosis not present

## 2017-07-10 DIAGNOSIS — T24002S Burn of unspecified degree of unspecified site of left lower limb, except ankle and foot, sequela: Secondary | ICD-10-CM | POA: Diagnosis not present

## 2017-07-10 DIAGNOSIS — E782 Mixed hyperlipidemia: Secondary | ICD-10-CM

## 2017-07-10 DIAGNOSIS — T24001S Burn of unspecified degree of unspecified site of right lower limb, except ankle and foot, sequela: Secondary | ICD-10-CM

## 2017-07-10 DIAGNOSIS — Z79899 Other long term (current) drug therapy: Secondary | ICD-10-CM | POA: Diagnosis not present

## 2017-07-10 DIAGNOSIS — I129 Hypertensive chronic kidney disease with stage 1 through stage 4 chronic kidney disease, or unspecified chronic kidney disease: Secondary | ICD-10-CM

## 2017-07-10 DIAGNOSIS — N183 Chronic kidney disease, stage 3 (moderate): Secondary | ICD-10-CM | POA: Diagnosis not present

## 2017-07-10 DIAGNOSIS — Z0000 Encounter for general adult medical examination without abnormal findings: Secondary | ICD-10-CM

## 2017-07-10 DIAGNOSIS — E785 Hyperlipidemia, unspecified: Secondary | ICD-10-CM | POA: Diagnosis not present

## 2017-07-10 MED ORDER — DILTIAZEM HCL ER COATED BEADS 180 MG PO CP24: 180.0000 mg | ORAL_CAPSULE | Freq: Every day | ORAL | 0 refills | Status: DC

## 2017-07-10 NOTE — Assessment & Plan Note (Addendum)
Patient received her annual flu shot today.   She declined pap smear today. She was reminded to set up her mammogram appointment.

## 2017-07-10 NOTE — Assessment & Plan Note (Signed)
At last visit patient was changed from amlodipine to diltiazem for inc microalbuminuria. She has continued taking lisinopril  daily and HCTZ  daily.  BP still elevated.  Plan: --continue lisinopril  daily and HCTZ  daily --increase diltiazem to  daily --f/u in 4 weeks for BP check

## 2017-07-10 NOTE — Assessment & Plan Note (Signed)
Patient with ASCVD risk >7.5, she declines starting statin today but will think about it and discuss at f/u visit.  Plan: --f/u lipid panel --discuss starting statin therapy at next visit

## 2017-07-10 NOTE — Patient Instructions (Signed)
I have increased the dose of your Cartia to  daily. Continue lisinopril and HCTZ as before.   I am checking your cholesterol levels today and we will talk about starting a cholesterol medicine at the next visit.  I will see you in 4-6 weeks to check on your blood pressure with the new dose.   Please schedule your mammogram.
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
## 2017-07-11 LAB — LIPID PANEL
CHOL/HDL RATIO: 3.1 ratio (ref 0.0–4.4)
Cholesterol, Total: 151 mg/dL (ref 100–199)
HDL: 48 mg/dL (ref >39)
LDL Calculated: 73 mg/dL (ref 0–99)
TRIGLYCERIDES: 149 mg/dL (ref 0–149)
VLDL Cholesterol Cal: 30 mg/dL (ref 5–40)

## 2017-07-11 NOTE — Progress Notes (Signed)
Case discussed with Dr. Svalina at the time of the visit. We reviewed the resident's history and exam and pertinent patient test results. I agree with the assessment, diagnosis, and plan of care documented in the resident's note. 

## 2017-07-23 ENCOUNTER — Other Ambulatory Visit: Payer: Self-pay | Admitting: *Deleted

## 2017-07-23 MED ORDER — INSULIN ASPART PROT & ASPART (70-30 MIX) 100 UNIT/ML PEN: 18.0000 [IU] | PEN_INJECTOR | Freq: Every day | SUBCUTANEOUS | 3 refills | Status: DC

## 2017-10-04 ENCOUNTER — Other Ambulatory Visit: Payer: Self-pay | Admitting: *Deleted

## 2017-10-04 DIAGNOSIS — I10 Essential (primary) hypertension: Secondary | ICD-10-CM

## 2017-10-06 MED ORDER — DILTIAZEM HCL ER COATED BEADS 180 MG PO CP24: 180.0000 mg | ORAL_CAPSULE | Freq: Every day | ORAL | 0 refills | Status: DC

## 2017-11-05 ENCOUNTER — Other Ambulatory Visit: Payer: Self-pay | Admitting: *Deleted

## 2017-11-05 DIAGNOSIS — I10 Essential (primary) hypertension: Secondary | ICD-10-CM

## 2017-11-05 MED ORDER — LISINOPRIL 40 MG PO TABS: 40.0000 mg | ORAL_TABLET | Freq: Every day | ORAL | 3 refills | Status: DC

## 2017-11-05 MED ORDER — INSULIN PEN NEEDLE 31G X 5 MM MISC: 11 refills | Status: DC

## 2017-11-27 ENCOUNTER — Other Ambulatory Visit: Payer: Self-pay

## 2017-11-27 ENCOUNTER — Ambulatory Visit (INDEPENDENT_AMBULATORY_CARE_PROVIDER_SITE_OTHER): Payer: PPO | Admitting: Internal Medicine

## 2017-11-27 VITALS — BP 138/78 | HR 84 | Temp 98.6°F | Wt 151.3 lb

## 2017-11-27 DIAGNOSIS — E11319 Type 2 diabetes mellitus with unspecified diabetic retinopathy without macular edema: Secondary | ICD-10-CM

## 2017-11-27 DIAGNOSIS — Z794 Long term (current) use of insulin: Secondary | ICD-10-CM | POA: Diagnosis not present

## 2017-11-27 DIAGNOSIS — Z0000 Encounter for general adult medical examination without abnormal findings: Secondary | ICD-10-CM

## 2017-11-27 DIAGNOSIS — E1122 Type 2 diabetes mellitus with diabetic chronic kidney disease: Secondary | ICD-10-CM | POA: Diagnosis not present

## 2017-11-27 DIAGNOSIS — N183 Chronic kidney disease, stage 3 unspecified: Secondary | ICD-10-CM

## 2017-11-27 DIAGNOSIS — R809 Proteinuria, unspecified: Secondary | ICD-10-CM

## 2017-11-27 DIAGNOSIS — E785 Hyperlipidemia, unspecified: Secondary | ICD-10-CM

## 2017-11-27 DIAGNOSIS — Z79899 Other long term (current) drug therapy: Secondary | ICD-10-CM

## 2017-11-27 DIAGNOSIS — E1169 Type 2 diabetes mellitus with other specified complication: Secondary | ICD-10-CM

## 2017-11-27 DIAGNOSIS — I129 Hypertensive chronic kidney disease with stage 1 through stage 4 chronic kidney disease, or unspecified chronic kidney disease: Secondary | ICD-10-CM

## 2017-11-27 DIAGNOSIS — I10 Essential (primary) hypertension: Secondary | ICD-10-CM

## 2017-11-27 DIAGNOSIS — Z9189 Other specified personal risk factors, not elsewhere classified: Secondary | ICD-10-CM

## 2017-11-27 LAB — POCT GLYCOSYLATED HEMOGLOBIN (HGB A1C): Hemoglobin A1C: 7.9

## 2017-11-27 LAB — GLUCOSE, CAPILLARY: Glucose-Capillary: 159 mg/dL — ABNORMAL HIGH (ref 65–99)

## 2017-11-27 MED ORDER — SITAGLIPTIN PHOS-METFORMIN HCL 50-1000 MG PO TABS: 1.0000 | ORAL_TABLET | Freq: Two times a day (BID) | ORAL | 3 refills | Status: DC

## 2017-11-27 MED ORDER — INSULIN ASPART PROT & ASPART (70-30 MIX) 100 UNIT/ML PEN: 18.0000 [IU] | PEN_INJECTOR | Freq: Two times a day (BID) | SUBCUTANEOUS | 8 refills | Status: DC

## 2017-11-27 MED ORDER — ROSUVASTATIN CALCIUM 20 MG PO TABS: 20.0000 mg | ORAL_TABLET | Freq: Every day | ORAL | 3 refills | Status: DC

## 2017-11-27 NOTE — Patient Instructions (Signed)
Start janumet one pill twice a day with meals - this replaces your metformin. Start atorvastatin 40mg  daily for your cholesterol.  Set up your appointment for your eye exam.  Set up your mammogram appointment

## 2017-11-27 NOTE — Progress Notes (Signed)
   CC: Hypertension  HPI:  [name] is a 67 y.o. with a PMH of hypertension, type 2 diabetes, hyperlipidemia, CKD presenting to clinic for follow-up of her diabetes and hypertension.  Hypertension Patient compliant with diltiazem 180 mg daily, lisinopril 40 mg daily, and HCTZ 25 mg daily.  She denies chest pain, shortness breath, headaches, vision or hearing changes, focal weakness or numbness.  Type 2 diabetes Last A1c 7.6 in August 2018.  She has comorbidities of mild microalbuminemia urea with CKD, and mild retinopathy.  She states compliance with metformin 1000mg  twice daily and Novolin 70/30 mix 18 units twice daily with meals.  She forgot her glucometer today but states her blood sugars are overall well controlled.  She does have concern of taking evening insulin dose when her sugar is 100-120 range; she worries that her sugar might drop down to low so she make sure to eat ice cream at night.  She does endorse waking up a couple times a month with CBGs in the 60s.  She denies polyuria, polydipsia, nausea, vomiting.   Please see problem based Assessment and Plan for status of patients chronic conditions.  Past Medical History:  Diagnosis Date  . CKD (chronic kidney disease), stage III   . Diabetes mellitus   . History of depression   . Hyperlipidemia   . Hyperparathyroidism    ? h/o hyperCa, no PTH level in chart, uptake study was negative; did not have intervention  . Hypertension     Review of Systems:   ROS per HPI  Physical Exam:  Vitals:   11/27/17 1351 11/27/17 1431  BP: (!) 149/78 138/78  Pulse: 84   Temp: 98.6 F (37 C)   TempSrc: Oral   SpO2: 100%   Weight: 151 lb 4.8 oz (68.6 kg)    GENERAL- alert, co-operative, appears as stated age, not in any distress. HEENT- Atraumatic, normocephalic, oral mucosa appears moist CARDIAC- RRR, no murmurs, rubs or gallops. RESP- Moving equal volumes of air, and clear to auscultation bilaterally, no wheezes or  crackles. ABDOMEN- Soft, nontender, bowel sounds present. NEURO- No obvious Cr N abnormality. EXTREMITIES- pulse 2+, symmetric, no pedal edema. SKIN- Warm, dry. PSYCH- Normal mood and affect, appropriate thought content and speech.  Assessment & Plan:   See Encounters Tab for problem based charting.   Patient discussed with Dr. Valla LeaverButcher   Mozelle Remlinger, MD Internal Medicine PGY2

## 2017-11-28 ENCOUNTER — Encounter: Payer: Self-pay | Admitting: Internal Medicine

## 2017-11-28 NOTE — Assessment & Plan Note (Signed)
Well-controlled.  She is asymptomatic  Plan --Continue HCTZ 25 mg daily, diltiazem 180 mg daily, lisinopril 40 mg daily

## 2017-11-28 NOTE — Assessment & Plan Note (Signed)
Patient to make mammogram appointment. She is in agreement with the fecal immunochemical testing as screening for colorectal cancer.  She is to pick up kit today. She declines Tdap vaccination today She declines Pap smear today but states that she will get a follow-up appointment.

## 2017-11-28 NOTE — Assessment & Plan Note (Addendum)
Last A1c 7.6 in August 2018.  She has comorbidities of mild microalbuminemia urea with CKD, and mild retinopathy.  She states compliance with metformin 1000mg  twice daily and Novolin 70/30 mix 18 units twice daily with meals.  She forgot her glucometer today but states her blood sugars are overall well controlled.  She does have concern of taking evening insulin dose when her sugar is 100-120 range; she worries that her sugar might drop down to low so she make sure to eat ice cream at night.  She does endorse waking up a couple times a month with CBGs in the 60s.  She denies polyuria, polydipsia, nausea, vomiting.   Plan --A1c 7.9 today-worsening --Change to sitagliptin-metformin 50-1000mg  twice daily --Now on 70/30 mix 18 units in the morning, 14-18 units with dinner based on CBG --Check CBGs 2 or 3 times daily --Follow-up in 3 months, patient to call earlier if noticing significant and consistent hypoglycemia --In the future can consider possibly switching to just long-acting insulin in combination with oral hypoglycemics.  May be a candidate for continuous glucose monitoring. --Foot exam performed today --ASCVD risk elevated, patient in agreement starting rosuvastatin 20 mg daily --Advised patient to make appointment for diabetic retinal exam

## 2017-11-29 NOTE — Progress Notes (Signed)
Internal Medicine Clinic Attending  Case discussed with Dr. Svalina  at the time of the visit.  We reviewed the resident's history and exam and pertinent patient test results.  I agree with the assessment, diagnosis, and plan of care documented in the resident's note.  

## 2018-01-06 ENCOUNTER — Other Ambulatory Visit: Payer: Self-pay

## 2018-01-06 DIAGNOSIS — I10 Essential (primary) hypertension: Secondary | ICD-10-CM

## 2018-01-06 NOTE — Telephone Encounter (Signed)
diltiazem (CARTIA XT) 180 MG 24 hr capsule, Refill request @ [facility] on [address].

## 2018-01-07 MED ORDER — DILTIAZEM HCL ER COATED BEADS 180 MG PO CP24: 180.0000 mg | ORAL_CAPSULE | Freq: Every day | ORAL | 1 refills | Status: DC

## 2018-02-04 DIAGNOSIS — H52223 Regular astigmatism, bilateral: Secondary | ICD-10-CM | POA: Diagnosis not present

## 2018-02-04 DIAGNOSIS — H524 Presbyopia: Secondary | ICD-10-CM | POA: Diagnosis not present

## 2018-02-04 DIAGNOSIS — H11153 Pinguecula, bilateral: Secondary | ICD-10-CM | POA: Diagnosis not present

## 2018-02-04 DIAGNOSIS — H40003 Preglaucoma, unspecified, bilateral: Secondary | ICD-10-CM | POA: Diagnosis not present

## 2018-02-04 DIAGNOSIS — H11423 Conjunctival edema, bilateral: Secondary | ICD-10-CM | POA: Diagnosis not present

## 2018-02-04 DIAGNOSIS — H5201 Hypermetropia, right eye: Secondary | ICD-10-CM | POA: Diagnosis not present

## 2018-02-04 DIAGNOSIS — H25013 Cortical age-related cataract, bilateral: Secondary | ICD-10-CM | POA: Diagnosis not present

## 2018-02-04 DIAGNOSIS — H18413 Arcus senilis, bilateral: Secondary | ICD-10-CM | POA: Diagnosis not present

## 2018-02-04 DIAGNOSIS — H2513 Age-related nuclear cataract, bilateral: Secondary | ICD-10-CM | POA: Diagnosis not present

## 2018-02-04 DIAGNOSIS — Z794 Long term (current) use of insulin: Secondary | ICD-10-CM | POA: Diagnosis not present

## 2018-02-04 DIAGNOSIS — H25043 Posterior subcapsular polar age-related cataract, bilateral: Secondary | ICD-10-CM | POA: Diagnosis not present

## 2018-02-04 DIAGNOSIS — E119 Type 2 diabetes mellitus without complications: Secondary | ICD-10-CM | POA: Diagnosis not present

## 2018-02-04 LAB — HM DIABETES EYE EXAM

## 2018-03-06 ENCOUNTER — Telehealth: Payer: Self-pay | Admitting: Internal Medicine
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
## 2018-05-21 ENCOUNTER — Encounter: Payer: Self-pay | Admitting: Internal Medicine

## 2018-05-21 ENCOUNTER — Ambulatory Visit (INDEPENDENT_AMBULATORY_CARE_PROVIDER_SITE_OTHER): Payer: PPO | Admitting: Internal Medicine

## 2018-05-21 VITALS — BP 138/81 | HR 69 | Temp 98.8°F | Ht 64.0 in | Wt 137.8 lb

## 2018-05-21 DIAGNOSIS — I129 Hypertensive chronic kidney disease with stage 1 through stage 4 chronic kidney disease, or unspecified chronic kidney disease: Secondary | ICD-10-CM | POA: Diagnosis not present

## 2018-05-21 DIAGNOSIS — I10 Essential (primary) hypertension: Secondary | ICD-10-CM

## 2018-05-21 DIAGNOSIS — Z79899 Other long term (current) drug therapy: Secondary | ICD-10-CM | POA: Diagnosis not present

## 2018-05-21 DIAGNOSIS — Z780 Asymptomatic menopausal state: Secondary | ICD-10-CM

## 2018-05-21 DIAGNOSIS — Z794 Long term (current) use of insulin: Secondary | ICD-10-CM | POA: Diagnosis not present

## 2018-05-21 DIAGNOSIS — Z9189 Other specified personal risk factors, not elsewhere classified: Secondary | ICD-10-CM

## 2018-05-21 DIAGNOSIS — N183 Chronic kidney disease, stage 3 unspecified: Secondary | ICD-10-CM

## 2018-05-21 DIAGNOSIS — E1122 Type 2 diabetes mellitus with diabetic chronic kidney disease: Secondary | ICD-10-CM | POA: Diagnosis not present

## 2018-05-21 DIAGNOSIS — E785 Hyperlipidemia, unspecified: Secondary | ICD-10-CM

## 2018-05-21 DIAGNOSIS — Z0000 Encounter for general adult medical examination without abnormal findings: Secondary | ICD-10-CM

## 2018-05-21 LAB — POCT GLYCOSYLATED HEMOGLOBIN (HGB A1C): Hemoglobin A1C: 9.2 % — AB (ref 4.0–5.6)

## 2018-05-21 LAB — GLUCOSE, CAPILLARY: GLUCOSE-CAPILLARY: 181 mg/dL — AB (ref 70–99)

## 2018-05-21 NOTE — Assessment & Plan Note (Signed)
Patient declined pap smear today, will plan on next visit. She was reminded to schedule her mammogram and dexa scans. We will continue addressing colorectal cancer screening and Tdap vaccination at f/u visits.

## 2018-05-21 NOTE — Assessment & Plan Note (Signed)
Well controlled and asymptomatic.  Plan: --continue HCTZ 25mg  daily, diltiazem 180mg  daily, and lisinopril 40mg  daily.

## 2018-05-21 NOTE — Patient Instructions (Signed)
Please start taking your insulin 18 units twice a day (with breakfast and with dinner) Continue the Janumet and all your other medications.   Please schedule your mammogram and dexa scan.  We'll see you back in one month with your glucometer. If you find that your sugars are getting low please call and make an appointment sooner.

## 2018-05-21 NOTE — Assessment & Plan Note (Addendum)
Uncontrolled. A1c today 9.2 from 7.9 previously in setting of dietary indiscretion and taking 70/30 insulin only once daily. She is symptomatic.  She has gotten her diabetic retinal exam in April of this year.   Plan: --Continue sitagliptin-metformin 50-1000mg  BID --Novolog 70/30 18 units BID --f/u Bmet for renal function --record request obtained for diabetic retinal exam --f/u in 1 month; she is to call if she has episodes of hypoglycemia

## 2018-05-22 ENCOUNTER — Encounter: Payer: Self-pay | Admitting: *Deleted

## 2018-05-22 LAB — CBC
HEMATOCRIT: 35.9 % (ref 34.0–46.6)
HEMOGLOBIN: 11 g/dL — AB (ref 11.1–15.9)
MCH: 26.3 pg — AB (ref 26.6–33.0)
MCHC: 30.6 g/dL — ABNORMAL LOW (ref 31.5–35.7)
MCV: 86 fL (ref 79–97)
Platelets: 261 10*3/uL (ref 150–450)
RBC: 4.18 x10E6/uL (ref 3.77–5.28)
RDW: 14.5 % (ref 12.3–15.4)
WBC: 6.4 10*3/uL (ref 3.4–10.8)

## 2018-05-22 LAB — BMP8+ANION GAP
ANION GAP: 15 mmol/L (ref 10.0–18.0)
BUN/Creatinine Ratio: 14 (ref 12–28)
BUN: 18 mg/dL (ref 8–27)
CALCIUM: 9.4 mg/dL (ref 8.7–10.3)
CO2: 23 mmol/L (ref 20–29)
Chloride: 103 mmol/L (ref 96–106)
Creatinine, Ser: 1.3 mg/dL — ABNORMAL HIGH (ref 0.57–1.00)
GFR, EST AFRICAN AMERICAN: 50 mL/min/{1.73_m2} — AB (ref >59)
GFR, EST NON AFRICAN AMERICAN: 43 mL/min/{1.73_m2} — AB (ref >59)
Glucose: 184 mg/dL — ABNORMAL HIGH (ref 65–99)
Potassium: 4.6 mmol/L (ref 3.5–5.2)
Sodium: 141 mmol/L (ref 134–144)

## 2018-05-23 ENCOUNTER — Telehealth: Payer: Self-pay | Admitting: *Deleted

## 2018-05-23 NOTE — Progress Notes (Signed)
Internal Medicine Clinic Attending  Case discussed with Dr. Svalina  at the time of the visit.  We reviewed the resident's history and exam and pertinent patient test results.  I agree with the assessment, diagnosis, and plan of care documented in the resident's note.  

## 2018-05-23 NOTE — Telephone Encounter (Addendum)
Call from Aundra MilletKaren, [facility] requesting PA info on Janumet ; states pt is in "[place]" , trying to get pt in a lower tier so she can afford medication.

## 2018-05-27 ENCOUNTER — Telehealth: Payer: Self-pay | Admitting: *Deleted

## 2018-05-27 NOTE — Telephone Encounter (Signed)
Will they cover the separate Januvia and Metformin?  Thanks!  Candida PeelingGorica

## 2018-05-27 NOTE — Telephone Encounter (Signed)
Information from [facility].  Janumet 50-1,000 mg tablets not covered.  Patient will need to try Rosanne Guttingcarbose-which is covered by her insurance.  Message to be sent to Dr. Samuella CotaSvalina to consider a medication change.  Angelina OkGladys Indiya Izquierdo, RN 05/27/2018 2:19 PM.

## 2018-06-13 ENCOUNTER — Other Ambulatory Visit: Payer: Self-pay | Admitting: Internal Medicine

## 2018-06-13 DIAGNOSIS — I10 Essential (primary) hypertension: Secondary | ICD-10-CM

## 2018-06-24 ENCOUNTER — Other Ambulatory Visit: Payer: Self-pay | Admitting: Internal Medicine

## 2018-06-24 DIAGNOSIS — I10 Essential (primary) hypertension: Secondary | ICD-10-CM

## 2018-06-27 ENCOUNTER — Ambulatory Visit: Payer: PPO

## 2018-07-08 ENCOUNTER — Ambulatory Visit (INDEPENDENT_AMBULATORY_CARE_PROVIDER_SITE_OTHER): Payer: PPO | Admitting: Internal Medicine

## 2018-07-08 ENCOUNTER — Encounter: Payer: Self-pay | Admitting: Internal Medicine

## 2018-07-08 ENCOUNTER — Encounter (INDEPENDENT_AMBULATORY_CARE_PROVIDER_SITE_OTHER): Payer: Self-pay

## 2018-07-08 ENCOUNTER — Other Ambulatory Visit (HOSPITAL_COMMUNITY)
Admission: RE | Admit: 2018-07-08 | Discharge: 2018-07-08 | Disposition: A | Discharge status: Home / Self Care | Payer: PPO | Source: Ambulatory Visit | Attending: Internal Medicine | Admitting: Internal Medicine

## 2018-07-08 VITALS — BP 164/83 | HR 72 | Temp 98.4°F | Wt 144.2 lb

## 2018-07-08 DIAGNOSIS — N183 Chronic kidney disease, stage 3 (moderate): Secondary | ICD-10-CM | POA: Diagnosis not present

## 2018-07-08 DIAGNOSIS — Z794 Long term (current) use of insulin: Secondary | ICD-10-CM

## 2018-07-08 DIAGNOSIS — Z124 Encounter for screening for malignant neoplasm of cervix: Secondary | ICD-10-CM | POA: Diagnosis not present

## 2018-07-08 DIAGNOSIS — E785 Hyperlipidemia, unspecified: Secondary | ICD-10-CM | POA: Diagnosis not present

## 2018-07-08 DIAGNOSIS — E1122 Type 2 diabetes mellitus with diabetic chronic kidney disease: Secondary | ICD-10-CM | POA: Diagnosis not present

## 2018-07-08 DIAGNOSIS — Z23 Encounter for immunization: Secondary | ICD-10-CM | POA: Diagnosis not present

## 2018-07-08 DIAGNOSIS — L905 Scar conditions and fibrosis of skin: Secondary | ICD-10-CM | POA: Diagnosis not present

## 2018-07-08 DIAGNOSIS — I129 Hypertensive chronic kidney disease with stage 1 through stage 4 chronic kidney disease, or unspecified chronic kidney disease: Secondary | ICD-10-CM

## 2018-07-08 DIAGNOSIS — N898 Other specified noninflammatory disorders of vagina: Secondary | ICD-10-CM | POA: Diagnosis not present

## 2018-07-08 DIAGNOSIS — T24001S Burn of unspecified degree of unspecified site of right lower limb, except ankle and foot, sequela: Secondary | ICD-10-CM

## 2018-07-08 DIAGNOSIS — Z0000 Encounter for general adult medical examination without abnormal findings: Secondary | ICD-10-CM

## 2018-07-08 DIAGNOSIS — T24002S Burn of unspecified degree of unspecified site of left lower limb, except ankle and foot, sequela: Secondary | ICD-10-CM

## 2018-07-08 DIAGNOSIS — X088XXS Exposure to other specified smoke, fire and flames, sequela: Secondary | ICD-10-CM | POA: Diagnosis not present

## 2018-07-08 MED ORDER — METFORMIN HCL 1000 MG PO TABS: 1000.0000 mg | ORAL_TABLET | Freq: Two times a day (BID) | ORAL | 1 refills | Status: DC

## 2018-07-08 MED ORDER — SITAGLIPTIN PHOSPHATE 100 MG PO TABS
100.0000 mg | ORAL_TABLET | Freq: Every day | ORAL | 1 refills | Status: DC
Start: 2018-07-08 — End: 2020-06-02

## 2018-07-08 MED ORDER — INSULIN ASPART PROT & ASPART (70-30 MIX) 100 UNIT/ML PEN: 16.0000 [IU] | PEN_INJECTOR | Freq: Two times a day (BID) | SUBCUTANEOUS | 8 refills | Status: DC

## 2018-07-08 NOTE — Assessment & Plan Note (Signed)
Patient compliant with Janumet 50-1000mg  BID, and Novolog mix 70/30 at 18 units BID; she reports CBGs in the 100s and feeling the need to eat an extra snack to prevent hypoglycemia; she had one known episode of symptomatic hypoglycemia since restarting BID novolog mix.   Plan: --decrease novolog mix to 16 units BID; asked that she take with breakfast and dinner --change janumet to [place] 100mg  daily and metformin 1000mg  BID due to cost --asked that she come back in ~4wks with glucometer for further titration of medications

## 2018-07-08 NOTE — Patient Instructions (Signed)
Decrease your insulin to 16 units twice daily; take with breakfast and with dinner. Keep checking your sugars 3-4 times a day.  I changed your Janumet to the separate meds of januvia 100mg  once daily, and metformin 1000mg  twice daily.   I will call you with the results of your pap.   I would like for you to come back in about a month or so with your glucometer to see if we need to further adjust your insulin.
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
## 2018-07-08 NOTE — Assessment & Plan Note (Signed)
Pap smear performed today; exam with erythematous lesion at 9 oclock position on cervix; it is not friable. Last pap smear was negative in 2009.

## 2018-07-09 LAB — CYTOLOGY - PAP: Diagnosis: NEGATIVE

## 2018-07-09 NOTE — Progress Notes (Signed)
Case discussed with Dr. Svalina soon after the resident saw the patient.  We reviewed the resident's history and exam and pertinent patient test results.  I agree with the assessment, diagnosis and plan of care documented in the resident's note. 

## 2018-07-10 ENCOUNTER — Telehealth: Payer: Self-pay | Admitting: Internal Medicine

## 2018-07-10 NOTE — Telephone Encounter (Signed)
Talked with patient about normal pap smear results. She had no further questions.  She mentioned that she was called by her pharmacy stating that even separating Januvia and metformin would cost her ~$300 out of pocket, just like the combination Janumet. Looked up [employer] formulary and [place] and janumet should be Tier 3 and $90/2663mo supply. Called pharmacy and they confirmed cost would be $300 for 3 mo supply.   Will forward to pharmacy and prior authorization RN to see if there are other avenues or preferred meds for the januvia.  Nyra MarketGorica Dacotah Cabello, MD IMTS - PGY3

## 2018-07-15 ENCOUNTER — Ambulatory Visit: Payer: PPO | Admitting: Pharmacist

## 2018-07-18 ENCOUNTER — Telehealth: Payer: Self-pay | Admitting: Pharmacist

## 2018-07-18 NOTE — Progress Notes (Signed)
Contacted patient to help with access to Januvia. Patient reports she would like to avoid Januvia for now due to BG being controlled on metformin and Novolog 70/30. She also states her income is too high to qualify for Medicare Extra Help. She inquired about why she needs to take Januvia, education was provided about DM management and complications including CKD. Advised patient to contact me in the future if further medication-related assistance is needed. I also advised patient to follow up with Norm Parcelonna Plyler for non-pharm DM support. Patient verbalized understanding.

## 2018-08-14 ENCOUNTER — Telehealth: Payer: Self-pay | Admitting: Internal Medicine

## 2018-09-02 ENCOUNTER — Other Ambulatory Visit: Payer: Self-pay

## 2018-09-02 ENCOUNTER — Ambulatory Visit (INDEPENDENT_AMBULATORY_CARE_PROVIDER_SITE_OTHER): Payer: PPO | Admitting: Internal Medicine

## 2018-09-02 ENCOUNTER — Encounter: Payer: Self-pay | Admitting: Internal Medicine

## 2018-09-02 VITALS — BP 123/74 | HR 72 | Temp 99.1°F | Ht 64.0 in | Wt 147.2 lb

## 2018-09-02 DIAGNOSIS — E1122 Type 2 diabetes mellitus with diabetic chronic kidney disease: Secondary | ICD-10-CM | POA: Diagnosis not present

## 2018-09-02 DIAGNOSIS — N183 Chronic kidney disease, stage 3 unspecified: Secondary | ICD-10-CM

## 2018-09-02 DIAGNOSIS — Z794 Long term (current) use of insulin: Secondary | ICD-10-CM | POA: Diagnosis not present

## 2018-09-02 LAB — POCT GLYCOSYLATED HEMOGLOBIN (HGB A1C): HEMOGLOBIN A1C: 7.9 % — AB (ref 4.0–5.6)

## 2018-09-02 LAB — GLUCOSE, CAPILLARY: GLUCOSE-CAPILLARY: 86 mg/dL (ref 70–99)

## 2018-09-02 MED ORDER — INSULIN PEN NEEDLE 31G X 5 MM MISC: 11 refills | Status: DC

## 2018-09-02 NOTE — Patient Instructions (Signed)
It was a pleasure taking care of you today, Stephanie Davis!  Your A1c has improved from 9.2 to 7.9. Great work! - We won't make any changes today to your diabetes medications - Continue Novolog 16u with breakfast and dinner - Continue Metformin 1,000mg  twice a day - Continue Januvia 100mg  daily - Follow-up with Lupita Leash, our dietician, to talk about diet and nutrition - Follow-up with Dr. Samuella Cota in 3 months for a repeat A1c

## 2018-09-02 NOTE — Progress Notes (Signed)
   CC: DM f/u  HPI:   [name] is a 67 y.o. female with DMII, CKD III, and the other medical conditions as listed below who presents to the internal medicine clinic for repeat A1c. Please see problem based charting for the status of the patient's current and chronic medical conditions.   Past Medical History:  Diagnosis Date  . CKD (chronic kidney disease), stage III (HCC)   . Diabetes mellitus   . History of depression   . Hyperlipidemia   . Hyperparathyroidism    ? h/o hyperCa, no PTH level in chart, uptake study was negative; did not have intervention  . Hypertension     Review of Systems:   Pertinent positives mentioned in HPI. Remainder of all ROS negative.  Physical Exam: Vitals:   09/02/18 1459  BP: 123/74  Pulse: 72  Temp: 99.1 F (37.3 C)  TempSrc: Oral  SpO2: 99%  Weight: 147 lb 3.2 oz (66.8 kg)  Height: 5\' 4"  (1.626 m)   Physical Exam  Constitutional: Well-developed, well-nourished, and in no distress.  Eyes: Pupils are equal, round, and reactive to light. EOM are normal.  Cardiovascular: Normal rate and regular rhythm. No murmurs, rubs, or gallops. Pulmonary/Chest: Effort normal. Clear to auscultation bilaterally. No wheezes, rales, or rhonchi. Abdominal: Bowel sounds present. Soft, non-distended, non-tender. Ext: No lower extremity edema. Skin: Warm and dry. No rashes or wounds.   Assessment & Plan:   See Encounters Tab for problem based charting.  Patient seen with Dr. Josem Kaufmann

## 2018-09-02 NOTE — Assessment & Plan Note (Signed)
Her A1c has improved from 9.2 in July to 7.9 today. Her current regimen includes Novolog 16u BID with breakfast and dinner, Januvia 100mg  daily, and Metformin 1,000mg  BID. She reports that she has had two episodes of hypoglycemia in the past 2 months. Her lowest sugar was 69 and she felt shaky with it, but felt better after drinking a soda. She believes these two episodes happened when she ate less for dinner, but took the same amount of Novolog. She states that she eats a true breakfast, but then snacks for the rest of the day without having a true meal and finishes the day with a bowl of ice cream. She is willing to meet with Lupita Leash for nutrition education. She would like to try dietary changes to get her A1c even lower before switching her medication regimen again.  Plan 1. Continue Novolog 16u BID with breakfast and dinner, Januvia 100mg  daily, and Metformin 1,000mg  BID 2. Referral to nutrition and diabetes services 3. F/u with PCP in 3 months for repeat A1c

## 2018-09-05 NOTE — Progress Notes (Signed)
Patient ID: Stephanie Davis, female   DOB: 06-Jan-1951, 67 y.o.   MRN: [id number]  I saw and evaluated the patient.  I personally confirmed the key portions of Dr. Letitia Neri history and exam and reviewed pertinent patient test results.  The assessment, diagnosis, and plan were formulated together and I agree with the documentation in the resident's note.

## 2018-10-06 ENCOUNTER — Encounter: Payer: PPO | Admitting: Dietician

## 2018-11-05 ENCOUNTER — Other Ambulatory Visit: Payer: Self-pay | Admitting: Internal Medicine

## 2018-11-05 DIAGNOSIS — I10 Essential (primary) hypertension: Secondary | ICD-10-CM

## 2018-11-06 NOTE — Addendum Note (Signed)
Addended by: Neomia Dear on: 11/06/2018 07:28 AM   Modules accepted: Orders

## 2018-12-01 ENCOUNTER — Other Ambulatory Visit: Payer: Self-pay | Admitting: Internal Medicine

## 2018-12-01 DIAGNOSIS — N183 Chronic kidney disease, stage 3 unspecified: Secondary | ICD-10-CM

## 2018-12-01 DIAGNOSIS — E1122 Type 2 diabetes mellitus with diabetic chronic kidney disease: Secondary | ICD-10-CM

## 2018-12-01 DIAGNOSIS — Z794 Long term (current) use of insulin: Principal | ICD-10-CM

## 2018-12-06 ENCOUNTER — Other Ambulatory Visit: Payer: Self-pay | Admitting: Internal Medicine

## 2018-12-06 DIAGNOSIS — I10 Essential (primary) hypertension: Secondary | ICD-10-CM

## 2018-12-06 DIAGNOSIS — N183 Chronic kidney disease, stage 3 unspecified: Secondary | ICD-10-CM

## 2018-12-06 DIAGNOSIS — E1122 Type 2 diabetes mellitus with diabetic chronic kidney disease: Secondary | ICD-10-CM

## 2018-12-06 DIAGNOSIS — Z794 Long term (current) use of insulin: Principal | ICD-10-CM

## 2018-12-10 ENCOUNTER — Other Ambulatory Visit: Payer: Self-pay | Admitting: Internal Medicine

## 2018-12-10 DIAGNOSIS — E1122 Type 2 diabetes mellitus with diabetic chronic kidney disease: Secondary | ICD-10-CM

## 2018-12-10 DIAGNOSIS — N183 Chronic kidney disease, stage 3 (moderate): Principal | ICD-10-CM

## 2018-12-10 DIAGNOSIS — Z794 Long term (current) use of insulin: Principal | ICD-10-CM

## 2018-12-11 ENCOUNTER — Other Ambulatory Visit: Payer: Self-pay | Admitting: *Deleted

## 2018-12-11 DIAGNOSIS — I10 Essential (primary) hypertension: Secondary | ICD-10-CM

## 2018-12-11 DIAGNOSIS — Z794 Long term (current) use of insulin: Secondary | ICD-10-CM

## 2018-12-11 DIAGNOSIS — N183 Chronic kidney disease, stage 3 unspecified: Secondary | ICD-10-CM

## 2018-12-11 DIAGNOSIS — E1122 Type 2 diabetes mellitus with diabetic chronic kidney disease: Secondary | ICD-10-CM

## 2018-12-11 MED ORDER — INSULIN PEN NEEDLE 31G X 5 MM MISC: 11 refills | Status: DC

## 2018-12-11 NOTE — Telephone Encounter (Signed)
Fax from [employer] - stated they did not received rx for pen needles written on 09/02/18 w/RF. Please re-send.

## 2018-12-26 ENCOUNTER — Other Ambulatory Visit: Payer: Self-pay | Admitting: Internal Medicine

## 2018-12-26 DIAGNOSIS — I10 Essential (primary) hypertension: Secondary | ICD-10-CM

## 2019-01-05 ENCOUNTER — Telehealth: Payer: Self-pay | Admitting: Dietician

## 2019-01-05 NOTE — Telephone Encounter (Signed)
Calling about diabetes follow up.

## 2019-01-12 NOTE — Telephone Encounter (Signed)
Called to follow up on her diabetes. She reports no questions or concerns. Her blood sugars are running 100-160-180 and she feels great.

## 2019-01-13 ENCOUNTER — Other Ambulatory Visit: Payer: Self-pay | Admitting: Internal Medicine

## 2019-01-13 DIAGNOSIS — Z794 Long term (current) use of insulin: Principal | ICD-10-CM

## 2019-01-13 DIAGNOSIS — E1122 Type 2 diabetes mellitus with diabetic chronic kidney disease: Secondary | ICD-10-CM

## 2019-01-13 DIAGNOSIS — N183 Chronic kidney disease, stage 3 unspecified: Secondary | ICD-10-CM

## 2019-02-16 ENCOUNTER — Other Ambulatory Visit: Payer: Self-pay | Admitting: Internal Medicine

## 2019-02-16 DIAGNOSIS — Z794 Long term (current) use of insulin: Principal | ICD-10-CM

## 2019-02-16 DIAGNOSIS — E1122 Type 2 diabetes mellitus with diabetic chronic kidney disease: Secondary | ICD-10-CM

## 2019-02-16 DIAGNOSIS — N183 Chronic kidney disease, stage 3 (moderate): Principal | ICD-10-CM

## 2019-02-16 NOTE — Telephone Encounter (Signed)
Next appt scheduled 5/13 with PCP. 

## 2019-03-04 ENCOUNTER — Other Ambulatory Visit: Payer: Self-pay | Admitting: Internal Medicine

## 2019-03-04 DIAGNOSIS — Z794 Long term (current) use of insulin: Principal | ICD-10-CM

## 2019-03-04 DIAGNOSIS — N183 Chronic kidney disease, stage 3 (moderate): Principal | ICD-10-CM

## 2019-03-04 DIAGNOSIS — E1122 Type 2 diabetes mellitus with diabetic chronic kidney disease: Secondary | ICD-10-CM

## 2019-03-04 DIAGNOSIS — I10 Essential (primary) hypertension: Secondary | ICD-10-CM

## 2019-03-11 ENCOUNTER — Other Ambulatory Visit: Payer: Self-pay

## 2019-03-11 ENCOUNTER — Ambulatory Visit (INDEPENDENT_AMBULATORY_CARE_PROVIDER_SITE_OTHER): Payer: PPO | Admitting: Internal Medicine

## 2019-03-11 ENCOUNTER — Other Ambulatory Visit: Payer: Self-pay | Admitting: *Deleted

## 2019-03-11 ENCOUNTER — Encounter: Payer: Self-pay | Admitting: Internal Medicine

## 2019-03-11 DIAGNOSIS — I10 Essential (primary) hypertension: Secondary | ICD-10-CM

## 2019-03-11 DIAGNOSIS — Z794 Long term (current) use of insulin: Secondary | ICD-10-CM

## 2019-03-11 DIAGNOSIS — E1122 Type 2 diabetes mellitus with diabetic chronic kidney disease: Secondary | ICD-10-CM

## 2019-03-11 DIAGNOSIS — N183 Chronic kidney disease, stage 3 unspecified: Secondary | ICD-10-CM

## 2019-03-11 DIAGNOSIS — Z79899 Other long term (current) drug therapy: Secondary | ICD-10-CM | POA: Diagnosis not present

## 2019-03-11 DIAGNOSIS — Z0000 Encounter for general adult medical examination without abnormal findings: Secondary | ICD-10-CM

## 2019-03-11 DIAGNOSIS — E785 Hyperlipidemia, unspecified: Secondary | ICD-10-CM | POA: Diagnosis not present

## 2019-03-11 DIAGNOSIS — I129 Hypertensive chronic kidney disease with stage 1 through stage 4 chronic kidney disease, or unspecified chronic kidney disease: Secondary | ICD-10-CM

## 2019-03-11 DIAGNOSIS — E782 Mixed hyperlipidemia: Secondary | ICD-10-CM

## 2019-03-11 MED ORDER — INSULIN PEN NEEDLE 31G X 5 MM MISC: 11 refills | Status: DC

## 2019-03-11 NOTE — Assessment & Plan Note (Signed)
Patient endorses compliance with HCTZ, Diltiazem, and lisinopril; does not check her BP at home; denies symptoms of uncontrolled HTN.  Plan: -advised to check BP occasionally -continue HCTZ 25mg  daily, diltiazem 180mg  daily and lisinopril 40 mg daily -f/u in 3 months

## 2019-03-11 NOTE — Assessment & Plan Note (Signed)
Tolerating crestor.  ?

## 2019-03-11 NOTE — Assessment & Plan Note (Signed)
Discussed outstanding HM vaccinations and screenings; she continues to decline at this time. Advised that we will continue addressing at f/u appointments.

## 2019-03-11 NOTE — Assessment & Plan Note (Addendum)
Last A1c 7.9. Patient states she has stopped taking Januvia due to cost; she is taking metformin 1000mg  BID and Novolog 70/30 mix 16 units twice daily. She reports AM CBGs 120-140 with similar PM readings; occasionally sees readings in 200s after eating ice cream. She denies hypoglycemic episodes.  Plan: -f/u A1c -f/u Bmet to eval renal function -continue metformin 1000mg  BID and Novolog mix 16 units BID with meals -if A1c still elevated, can try to see if Janumet is affordable through her insurance -discussed lifestyle changes again especially in regards to moderating snacking on high carb foods -on ACE-I and statin -needs foot exam, PNA vaccination; needs ophtho exam once restrictions are lifted -f/u in 3 months

## 2019-03-11 NOTE — Progress Notes (Signed)
   CC: T2DM  This is a telephone encounter between Stephanie Davis and Stephanie Davis on 03/11/2019 for T2DM, HTN, CKD. The visit was conducted with the patient located at home and Stephanie Davis at [facility]. The patient's identity was confirmed using their DOB and current address. The patient has consented to being evaluated through a telephone encounter and understands the associated risks (an examination cannot be done and the patient may need to come in for an appointment) / benefits (allows the patient to remain at home, decreasing exposure to coronavirus). I personally spent 17 minutes on medical discussion.   HPI:  [name] is a 68 y.o. with PMH hypertension, type 2 diabetes, hyperlipidemia, CKD stage 3.   Please see A&P for assessment of the patient's acute and chronic medical conditions.   Past Medical History:  Diagnosis Date  . CKD (chronic kidney disease), stage III (HCC)   . Diabetes mellitus   . History of depression   . Hyperlipidemia   . Hyperparathyroidism    ? h/o hyperCa, no PTH level in chart, uptake study was negative; did not have intervention  . Hypertension    Review of Systems:  Patient denies chest pain, shortness of breath, headaches, vision changes, myalgias, hypoglycemic symptoms.  Assessment & Plan:   See Encounters Tab for problem based charting.  Patient discussed with Dr. Criselda Peaches

## 2019-03-19 NOTE — Progress Notes (Signed)
Internal Medicine Clinic Attending  Case discussed with Dr. Svalina soon after the resident saw the patient.  We reviewed the resident's history, telephone conversation and pertinent patient test results.  I agree with the assessment, diagnosis, and plan of care documented in the resident's note.   

## 2019-03-23 ENCOUNTER — Other Ambulatory Visit: Payer: Self-pay | Admitting: Internal Medicine

## 2019-03-23 DIAGNOSIS — I10 Essential (primary) hypertension: Secondary | ICD-10-CM

## 2019-04-09 ENCOUNTER — Encounter: Payer: Self-pay | Admitting: *Deleted

## 2019-04-28 ENCOUNTER — Telehealth: Payer: Self-pay | Admitting: *Deleted

## 2019-04-28 NOTE — Telephone Encounter (Signed)
Call to [facility] for PA for B-D UF Mini Pen Needles.  Refill to soon as patient has increased testing to 2 times daily.  Pharmacy will need to call for the override.  [facility] was called at  was called x 2  at [phone] to inform them of the need to call for the override.  Sander Nephew, RN 04/28/2019.  3:55 PM .

## 2019-04-29 ENCOUNTER — Other Ambulatory Visit: Payer: Self-pay | Admitting: Internal Medicine

## 2019-04-29 DIAGNOSIS — N183 Chronic kidney disease, stage 3 unspecified: Secondary | ICD-10-CM

## 2019-04-29 DIAGNOSIS — E1122 Type 2 diabetes mellitus with diabetic chronic kidney disease: Secondary | ICD-10-CM

## 2019-05-20 ENCOUNTER — Other Ambulatory Visit: Payer: Self-pay | Admitting: *Deleted

## 2019-05-20 DIAGNOSIS — N183 Chronic kidney disease, stage 3 unspecified: Secondary | ICD-10-CM

## 2019-05-20 DIAGNOSIS — E1122 Type 2 diabetes mellitus with diabetic chronic kidney disease: Secondary | ICD-10-CM

## 2019-05-20 MED ORDER — METFORMIN HCL 1000 MG PO TABS: ORAL_TABLET | ORAL | 0 refills | Status: DC

## 2019-05-20 NOTE — Telephone Encounter (Signed)
Last appt 03/11/19 Next appt 06/17/19 w/ pcp

## 2019-05-25 NOTE — Addendum Note (Signed)
Addended by: Gilles Chiquito B on: 05/25/2019 03:20 PM   Modules accepted: Orders

## 2019-06-11 ENCOUNTER — Other Ambulatory Visit: Payer: Self-pay | Admitting: *Deleted

## 2019-06-11 DIAGNOSIS — E1122 Type 2 diabetes mellitus with diabetic chronic kidney disease: Secondary | ICD-10-CM

## 2019-06-11 DIAGNOSIS — I10 Essential (primary) hypertension: Secondary | ICD-10-CM

## 2019-06-11 MED ORDER — ROSUVASTATIN CALCIUM 20 MG PO TABS: 20.0000 mg | ORAL_TABLET | Freq: Every day | ORAL | 1 refills | Status: DC

## 2019-06-11 MED ORDER — HYDROCHLOROTHIAZIDE 25 MG PO TABS: ORAL_TABLET | ORAL | 0 refills | Status: DC

## 2019-06-11 NOTE — Telephone Encounter (Signed)
Next appt scheduled 8/19 with PCP. 

## 2019-06-17 ENCOUNTER — Encounter: Payer: PPO | Admitting: Internal Medicine

## 2019-07-07 ENCOUNTER — Other Ambulatory Visit: Payer: Self-pay | Admitting: *Deleted

## 2019-07-07 DIAGNOSIS — I10 Essential (primary) hypertension: Secondary | ICD-10-CM

## 2019-07-08 ENCOUNTER — Ambulatory Visit (INDEPENDENT_AMBULATORY_CARE_PROVIDER_SITE_OTHER): Payer: PPO | Admitting: Internal Medicine

## 2019-07-08 ENCOUNTER — Other Ambulatory Visit: Payer: Self-pay

## 2019-07-08 ENCOUNTER — Encounter: Payer: Self-pay | Admitting: Internal Medicine

## 2019-07-08 VITALS — BP 162/80 | HR 64 | Ht 64.0 in | Wt 137.6 lb

## 2019-07-08 DIAGNOSIS — E1122 Type 2 diabetes mellitus with diabetic chronic kidney disease: Secondary | ICD-10-CM | POA: Diagnosis not present

## 2019-07-08 DIAGNOSIS — N183 Chronic kidney disease, stage 3 unspecified: Secondary | ICD-10-CM

## 2019-07-08 DIAGNOSIS — Z23 Encounter for immunization: Secondary | ICD-10-CM | POA: Diagnosis not present

## 2019-07-08 DIAGNOSIS — R32 Unspecified urinary incontinence: Secondary | ICD-10-CM | POA: Diagnosis not present

## 2019-07-08 DIAGNOSIS — Z794 Long term (current) use of insulin: Secondary | ICD-10-CM | POA: Diagnosis not present

## 2019-07-08 DIAGNOSIS — E785 Hyperlipidemia, unspecified: Secondary | ICD-10-CM

## 2019-07-08 DIAGNOSIS — I129 Hypertensive chronic kidney disease with stage 1 through stage 4 chronic kidney disease, or unspecified chronic kidney disease: Secondary | ICD-10-CM | POA: Diagnosis not present

## 2019-07-08 DIAGNOSIS — E782 Mixed hyperlipidemia: Secondary | ICD-10-CM

## 2019-07-08 DIAGNOSIS — Z79899 Other long term (current) drug therapy: Secondary | ICD-10-CM | POA: Diagnosis not present

## 2019-07-08 DIAGNOSIS — I10 Essential (primary) hypertension: Secondary | ICD-10-CM

## 2019-07-08 LAB — POCT GLYCOSYLATED HEMOGLOBIN (HGB A1C): Hemoglobin A1C: 9.1 % — AB (ref 4.0–5.6)

## 2019-07-08 LAB — GLUCOSE, CAPILLARY: Glucose-Capillary: 156 mg/dL — ABNORMAL HIGH (ref 70–99)

## 2019-07-08 MED ORDER — DILTIAZEM HCL ER COATED BEADS 180 MG PO CP24: 180.0000 mg | ORAL_CAPSULE | Freq: Every day | ORAL | 1 refills | Status: DC

## 2019-07-08 NOTE — Patient Instructions (Signed)
It was nice seeing you today! Thank you for choosing [facility] for your Primary Care.    Today we talked about:   1. Diabetes:  - increase Novolog to 18 units twice a day - try half sweet and half unsweetened ice cream   2. High blood pressure:  - Try Mrs. Dash on your tomatoes

## 2019-07-09 LAB — BMP8+ANION GAP
Anion Gap: 17 mmol/L (ref 10.0–18.0)
BUN/Creatinine Ratio: 17 (ref 12–28)
BUN: 20 mg/dL (ref 8–27)
CO2: 18 mmol/L — ABNORMAL LOW (ref 20–29)
Calcium: 9.4 mg/dL (ref 8.7–10.3)
Chloride: 104 mmol/L (ref 96–106)
Creatinine, Ser: 1.16 mg/dL — ABNORMAL HIGH (ref 0.57–1.00)
GFR calc Af Amer: 57 mL/min/{1.73_m2} — ABNORMAL LOW (ref >59)
GFR calc non Af Amer: 49 mL/min/{1.73_m2} — ABNORMAL LOW (ref >59)
Glucose: 164 mg/dL — ABNORMAL HIGH (ref 65–99)
Potassium: 4.4 mmol/L (ref 3.5–5.2)
Sodium: 139 mmol/L (ref 134–144)

## 2019-07-09 LAB — LIPID PANEL
Chol/HDL Ratio: 2.2 ratio (ref 0.0–4.4)
Cholesterol, Total: 72 mg/dL — ABNORMAL LOW (ref 100–199)
HDL: 33 mg/dL — ABNORMAL LOW (ref >39)
LDL Chol Calc (NIH): 23 mg/dL (ref 0–99)
Triglycerides: 72 mg/dL (ref 0–149)
VLDL Cholesterol Cal: 16 mg/dL (ref 5–40)

## 2019-07-11 NOTE — Assessment & Plan Note (Signed)
Current Medications:  Lisinopril 40mg , HCTZ 25mg , Diltiazem 180mg    Patient's blood pressure is elevated at today's appointment, despite patient taking all her medications as prescribed.  This is likely due to her diet recently which includes very high levels of sodium on her tomatoes.  We spent a significant time discussing how diet affects to her chronic problems.  We discussed alternatives to salt on her tomatoes, which includes Mrs. Dash.  Stephanie Davis reports she is willing to try this out.  PLAN:  - No medication changes - Diet changes discussed

## 2019-07-11 NOTE — Assessment & Plan Note (Addendum)
Current Medications: Metformin 1000mg  BID, Novolog 70/30 units BID  A1C: 9.1% (from 7.9%)  Stephanie Davis reports that she is having very high sugar ice cream every single night.  Her glucometer readings occasionally support this with sugars often in the 200s at night.  Despite this, her morning readings are not significantly elevated with just a couple exceptions.  We spent some time discussing doing half-and-half of normal ice cream and sugarless ice cream to improve blood sugar readings.  She is in agreement with this plan.    Her A1c at this visit is expectedly elevated.  At this time, plan to increase NovoLog.  CKD stable with creatinine of 1.16  PLAN:  - Novolog 18 units BID - Continue Metformin  - Dietary changes  - AIC at next appointment

## 2019-07-11 NOTE — Assessment & Plan Note (Signed)
Lipid Panel at this appointment showed a low cholesterol and low LDL. Continue with current regimen.

## 2019-07-11 NOTE — Progress Notes (Signed)
   CC: Diabetes Follow up  HPI:  [name] is a 68 y.o. female with a past medical history of type 2 diabetes complicated by chronic kidney disease stage III, hypertension, who presents to the clinic for evaluation of diabetes, in addition to meeting new PCP.  Stephanie Davis states that she has been taking all her medications as prescribed, which includes NovoLog 70/30 16 units twice a day and metformin thousand milligrams twice a day.  She denies any low blood sugars and feels comfortable recognizing what low blood sugars feel likes. She reports that she is not willing to stop eating ice cream every night.  She also eats lots of tomatoes and watermelon.  She brought her glucometer to the appointment today.  Stephanie Davis reports that she is taking all her high blood pressure medications as prescribed, which includes lisinopril, and hydrochlorothiazide.  However she has been eating lots of tomatoes, as it is tomato season, and she eats these covered in salt.  Reports that she would not be able to eat the tomatoes without salt.  Knows that this is contributing to her high blood pressure.  She denies any headaches, dizziness, chest pain, shortness of breath, leg swelling.  Stephanie Davis also endorses urinary incontinence, that occurs both when sneezing and just dribbles throughout the day.  She reports that this has been happening for many years and it does not affect her quality of life.  She wears depends when needed and has no concerns with this.  She is not particularly interested in pursuing this problem at this time.  Past Medical History:  Diagnosis Date  . CKD (chronic kidney disease), stage III ([place])   . Diabetes mellitus   . History of depression   . Hyperlipidemia   . Hyperparathyroidism    ? h/o hyperCa, no PTH level in chart, uptake study was negative; did not have intervention  . Hypertension    Review of Systems:   Review of Systems  Constitutional: Negative for chills, fever  and weight loss.  Respiratory: Negative for shortness of breath and wheezing.   Cardiovascular: Negative for chest pain, palpitations and leg swelling.  Gastrointestinal: Negative for abdominal pain, diarrhea, nausea and vomiting.  Genitourinary: Negative for dysuria and hematuria.       Endorses incontinence  Musculoskeletal: Negative for joint pain and myalgias.  Neurological: Negative for dizziness, weakness and headaches.   Physical Exam:  Vitals:   07/08/19 1131  BP: (!) 162/80  Pulse: 64  SpO2: 100%  Weight: 137 lb 9.6 oz (62.4 kg)  Height: 5\' 4"  (1.626 m)   Physical Exam Vitals signs and nursing note reviewed.  Constitutional:      Appearance: She is normal weight.  Cardiovascular:     Rate and Rhythm: Normal rate and regular rhythm.  Pulmonary:     Effort: Pulmonary effort is normal. No respiratory distress.  Musculoskeletal:     Right lower leg: No edema.     Left lower leg: No edema.  Skin:    General: Skin is warm and dry.     Findings: No bruising or lesion.  Neurological:     General: No focal deficit present.     Mental Status: She is alert and oriented to person, place, and time.    Assessment & Plan:   See Encounters Tab for problem based charting.  Patient seen with Dr. Dareen Piano

## 2019-07-12 NOTE — Addendum Note (Signed)
Addended by: Aldine Contes on: 07/12/2019 05:51 PM   Modules accepted: Level of Service

## 2019-07-12 NOTE — Progress Notes (Signed)
Internal Medicine Clinic Attending  I saw and evaluated the patient.  I personally confirmed the key portions of the history and exam documented by Dr. Basaraba and I reviewed pertinent patient test results.  The assessment, diagnosis, and plan were formulated together and I agree with the documentation in the resident's note.    

## 2019-08-18 ENCOUNTER — Other Ambulatory Visit: Payer: Self-pay | Admitting: Internal Medicine

## 2019-08-18 DIAGNOSIS — E1122 Type 2 diabetes mellitus with diabetic chronic kidney disease: Secondary | ICD-10-CM

## 2019-08-18 DIAGNOSIS — N183 Chronic kidney disease, stage 3 unspecified: Secondary | ICD-10-CM

## 2019-09-16 ENCOUNTER — Other Ambulatory Visit: Payer: Self-pay | Admitting: *Deleted

## 2019-09-16 DIAGNOSIS — I10 Essential (primary) hypertension: Secondary | ICD-10-CM

## 2019-09-17 MED ORDER — HYDROCHLOROTHIAZIDE 25 MG PO TABS: ORAL_TABLET | ORAL | 0 refills | Status: DC

## 2019-11-02 ENCOUNTER — Other Ambulatory Visit: Payer: Self-pay | Admitting: *Deleted

## 2019-11-02 DIAGNOSIS — I10 Essential (primary) hypertension: Secondary | ICD-10-CM

## 2019-11-02 MED ORDER — LISINOPRIL 40 MG PO TABS: 40.0000 mg | ORAL_TABLET | Freq: Every day | ORAL | 3 refills | Status: DC

## 2019-11-17 ENCOUNTER — Other Ambulatory Visit: Payer: Self-pay | Admitting: Internal Medicine

## 2019-11-17 DIAGNOSIS — E1122 Type 2 diabetes mellitus with diabetic chronic kidney disease: Secondary | ICD-10-CM

## 2019-11-17 DIAGNOSIS — Z794 Long term (current) use of insulin: Secondary | ICD-10-CM

## 2019-12-08 ENCOUNTER — Other Ambulatory Visit: Payer: Self-pay | Admitting: Internal Medicine

## 2019-12-08 DIAGNOSIS — I10 Essential (primary) hypertension: Secondary | ICD-10-CM

## 2019-12-16 ENCOUNTER — Encounter: Payer: PPO | Admitting: Internal Medicine

## 2019-12-21 ENCOUNTER — Other Ambulatory Visit: Payer: Self-pay | Admitting: Internal Medicine

## 2019-12-21 DIAGNOSIS — I10 Essential (primary) hypertension: Secondary | ICD-10-CM

## 2019-12-21 DIAGNOSIS — E1122 Type 2 diabetes mellitus with diabetic chronic kidney disease: Secondary | ICD-10-CM

## 2020-01-13 ENCOUNTER — Ambulatory Visit (INDEPENDENT_AMBULATORY_CARE_PROVIDER_SITE_OTHER): Payer: PPO | Admitting: Internal Medicine

## 2020-01-13 ENCOUNTER — Encounter: Payer: Self-pay | Admitting: Internal Medicine

## 2020-01-13 ENCOUNTER — Encounter (INDEPENDENT_AMBULATORY_CARE_PROVIDER_SITE_OTHER): Payer: Self-pay

## 2020-01-13 ENCOUNTER — Other Ambulatory Visit: Payer: Self-pay

## 2020-01-13 VITALS — BP 145/86 | HR 76 | Temp 98.9°F | Ht 64.0 in | Wt 135.7 lb

## 2020-01-13 DIAGNOSIS — N1831 Chronic kidney disease, stage 3a: Secondary | ICD-10-CM

## 2020-01-13 DIAGNOSIS — E1122 Type 2 diabetes mellitus with diabetic chronic kidney disease: Secondary | ICD-10-CM | POA: Diagnosis not present

## 2020-01-13 DIAGNOSIS — E785 Hyperlipidemia, unspecified: Secondary | ICD-10-CM

## 2020-01-13 DIAGNOSIS — I129 Hypertensive chronic kidney disease with stage 1 through stage 4 chronic kidney disease, or unspecified chronic kidney disease: Secondary | ICD-10-CM | POA: Diagnosis not present

## 2020-01-13 DIAGNOSIS — Z794 Long term (current) use of insulin: Secondary | ICD-10-CM | POA: Diagnosis not present

## 2020-01-13 DIAGNOSIS — Z79899 Other long term (current) drug therapy: Secondary | ICD-10-CM

## 2020-01-13 DIAGNOSIS — E1121 Type 2 diabetes mellitus with diabetic nephropathy: Secondary | ICD-10-CM

## 2020-01-13 DIAGNOSIS — N183 Chronic kidney disease, stage 3 unspecified: Secondary | ICD-10-CM

## 2020-01-13 DIAGNOSIS — E782 Mixed hyperlipidemia: Secondary | ICD-10-CM

## 2020-01-13 DIAGNOSIS — I10 Essential (primary) hypertension: Secondary | ICD-10-CM

## 2020-01-13 LAB — POCT GLYCOSYLATED HEMOGLOBIN (HGB A1C): Hemoglobin A1C: 7.9 % — AB (ref 4.0–5.6)

## 2020-01-13 LAB — GLUCOSE, CAPILLARY: Glucose-Capillary: 154 mg/dL — ABNORMAL HIGH (ref 70–99)

## 2020-01-13 MED ORDER — DILTIAZEM HCL ER COATED BEADS 180 MG PO CP24: 180.0000 mg | ORAL_CAPSULE | Freq: Every day | ORAL | 3 refills | Status: DC

## 2020-01-13 MED ORDER — GLUCOSE BLOOD VI STRP: ORAL_STRIP | 5 refills | Status: DC

## 2020-01-13 MED ORDER — HYDROCHLOROTHIAZIDE 25 MG PO TABS: 25.0000 mg | ORAL_TABLET | Freq: Every day | ORAL | 3 refills | Status: DC

## 2020-01-13 MED ORDER — BD PEN NEEDLE MINI U/F 31G X 5 MM MISC: 11 refills | Status: DC

## 2020-01-13 MED ORDER — LISINOPRIL 40 MG PO TABS: 40.0000 mg | ORAL_TABLET | Freq: Every day | ORAL | 3 refills | Status: DC

## 2020-01-13 MED ORDER — LANCETS 30G MISC: 5 refills | Status: DC

## 2020-01-13 MED ORDER — ROSUVASTATIN CALCIUM 20 MG PO TABS: 20.0000 mg | ORAL_TABLET | Freq: Every day | ORAL | 3 refills | Status: DC

## 2020-01-13 MED ORDER — NOVOLOG MIX 70/30 FLEXPEN (70-30) 100 UNIT/ML ~~LOC~~ SUPN: 18.0000 [IU] | PEN_INJECTOR | Freq: Two times a day (BID) | SUBCUTANEOUS | 2 refills | Status: DC

## 2020-01-13 MED ORDER — "INSULIN SYRINGE 30G X 5/16"" 0.5 ML MISC": 11 refills | Status: DC

## 2020-01-13 MED ORDER — METFORMIN HCL 1000 MG PO TABS: 1000.0000 mg | ORAL_TABLET | Freq: Two times a day (BID) | ORAL | 3 refills | Status: DC

## 2020-01-13 NOTE — Patient Instructions (Addendum)
It was nice seeing you today! Thank you for choosing [facility] for your Primary Care.    Today we talked about:   1. Diabetes: Your A1c has come down to 7.9%! Keep up the great work with reducing sugar in your diet. I am going to work with our Diabetic Educator, Lupita Leash, to find another oral medication that we can add on with the goal of one day coming off insulin. I or she will call you after we price check with your health insurance.   2. High blood pressure: Great job with reducing salt in your diet. No changes to medications today!   3. Mammogram: We will discuss this again after you received your COVID vaccine.   4. Referral to eye doctor placed

## 2020-01-13 NOTE — Progress Notes (Signed)
   CC: HTN and DM  HPI:  Ms.Nyna R Platas is a 69 y.o. with a PMHx of HTN, DM, CKD Stage 3 who presents to the clinic for HTN and DM.   Please see the Encounters tab for problem-based Assessment & Plan regarding status of patient's chronic conditions.  Past Medical History:  Diagnosis Date  . CKD (chronic kidney disease), stage III   . Diabetes mellitus   . History of depression   . Hyperlipidemia   . Hyperparathyroidism    ? h/o hyperCa, no PTH level in chart, uptake study was negative; did not have intervention  . Hypertension    Review of Systems: Review of Systems  Constitutional: Negative for chills and fever.  Respiratory: Negative for cough and shortness of breath.   Cardiovascular: Negative for chest pain and leg swelling.  Gastrointestinal: Negative for abdominal pain, diarrhea, nausea and vomiting.  Neurological: Negative for dizziness and headaches.   Physical Exam:  Vitals:   01/13/20 1331  BP: (!) 145/86  Pulse: 76  Temp: 98.9 F (37.2 C)  TempSrc: Oral  SpO2: 100%  Weight: 135 lb 11.2 oz (61.6 kg)  Height: 5\' 4"  (1.626 m)   Physical Exam Vitals and nursing note reviewed.  Constitutional:      General: She is not in acute distress.    Appearance: She is normal weight.  Cardiovascular:     Rate and Rhythm: Normal rate and regular rhythm.     Heart sounds: No murmur. No gallop.   Pulmonary:     Effort: Pulmonary effort is normal. No respiratory distress.     Breath sounds: Normal breath sounds. No wheezing or rales.  Musculoskeletal:     Right lower leg: No edema.     Left lower leg: No edema.  Skin:    General: Skin is warm and dry.  Neurological:     Mental Status: She is alert and oriented to person, place, and time.  Psychiatric:        Mood and Affect: Mood normal.        Behavior: Behavior normal.    Assessment & Plan:   See Encounters Tab for problem based charting.  Patient discussed with Dr. 

## 2020-01-14 LAB — BMP8+ANION GAP
Anion Gap: 14 mmol/L (ref 10.0–18.0)
BUN/Creatinine Ratio: 13 (ref 12–28)
BUN: 19 mg/dL (ref 8–27)
CO2: 20 mmol/L (ref 20–29)
Calcium: 9.3 mg/dL (ref 8.7–10.3)
Chloride: 105 mmol/L (ref 96–106)
Creatinine, Ser: 1.52 mg/dL — ABNORMAL HIGH (ref 0.57–1.00)
GFR calc Af Amer: 41 mL/min/{1.73_m2} — ABNORMAL LOW (ref >59)
GFR calc non Af Amer: 35 mL/min/{1.73_m2} — ABNORMAL LOW (ref >59)
Glucose: 168 mg/dL — ABNORMAL HIGH (ref 65–99)
Potassium: 4.7 mmol/L (ref 3.5–5.2)
Sodium: 139 mmol/L (ref 134–144)

## 2020-01-16 ENCOUNTER — Encounter: Payer: Self-pay | Admitting: Internal Medicine

## 2020-01-16 NOTE — Assessment & Plan Note (Signed)
BP: (!) 145/86  Stephanie Davis state she has been working on decreasing salt intake, switching from regular table salt to Mrs. Dash. She has also increased her weekly exercise by walking 3x per week. Her nephew is a Systems analyst and helps her with this. Since doing so, she has been feeling much more energetic.   Assessment:  Stephanie Davis has made great improvements in her blood pressure with lifestyle modification. No medication changes planned at this time.   Plan:  - Diltiazem 180 mg QD - HCTZ 25 mg QD - Lisinopril 40 mg QD

## 2020-01-16 NOTE — Assessment & Plan Note (Addendum)
Lab Results  Component Value Date   HGBA1C 7.9 (A) 01/13/2020   Stephanie Davis states she takes her Novolog 70/30 twice per day at 6 am and 8:30 pm without difficulty. She also takes Metformin daily. She deneis any symptoms consistent with hypoglycemia including palpitations or jittery-ness.   She has been working on her diet, including decreased carbohydrate intake. She still eats her nightly ice cream but has been doing 1/2 sugar-free.   We discussed that Januvia is on her medication list but she does not report taking it. She remembers Januvia being mentioned but was unable to start because of it's high price.   Assessment:  Stephanie Davis has been working on improving her glycemic control and has been successful as demonstrated by the decrease in her A1c. At her last visit, we increased her Novolog from 16 units to 18. She has tolerated this change well.   Given her HTN, HLD and CKD, she would certainly benefit from an GLP1 or SGLT2i for renal/cardiac protection. Will work with Lupita Leash to determine medication that would be affordable for the patient.   BMP shows stable CKD.   Plan:  - Continue Novolog 18 units BID  - Continue Metformin  - Reach out to Lupita Leash regarding GLP-1/SGLT2i - Referral to ophthalmology for diabetic eye exam - BMP

## 2020-01-16 NOTE — Assessment & Plan Note (Signed)
Lab Results  Component Value Date   CHOL 72 (L) 07/08/2019   HDL 33 (L) 07/08/2019   LDLCALC 23 07/08/2019   TRIG 72 07/08/2019   CHOLHDL 2.2 07/08/2019   Stephanie Davis states she is tolerating Rosuvastatin without difficulty.   Assessment:  LDL is within goal.   Plan:  - Continue Rosuvastatin 20 mg QD

## 2020-01-18 NOTE — Progress Notes (Signed)
Internal Medicine Clinic Attending  Case discussed with Dr. Basaraba at the time of the visit.  We reviewed the resident's history and exam and pertinent patient test results.  I agree with the assessment, diagnosis, and plan of care documented in the resident's note.    

## 2020-01-19 ENCOUNTER — Other Ambulatory Visit: Payer: Self-pay | Admitting: Dietician

## 2020-01-19 DIAGNOSIS — Z794 Long term (current) use of insulin: Secondary | ICD-10-CM

## 2020-01-19 NOTE — Progress Notes (Signed)
Referral to [facility] pharmacy for medication assistance

## 2020-02-02 ENCOUNTER — Telehealth: Payer: Self-pay | Admitting: Dietician

## 2020-02-02 NOTE — Telephone Encounter (Signed)
Spoke with Jodelle Green from [employer] and explained Dr. Renaee Munda request below. She says they were not aware of Ms. Wojciak's affordability problems until our conversation today. Jodelle Green explained she will report back to [facility] Ang and their care management team would most likely reach ou to the patient to see if she is willing to switch to the [employer] H[employer] CNSP plan- chronic special needs plan- diabetes, heart failure and live in [employer] . If she has this plan, most of her diabetes medicines would be zero copay Info can be found on website about which medicines are zero copay on this plan; empagliflozin and semaglutide, dulaglutide and liraglutide are.  Patient made aware she may be getting a phone call about changing her [employer] plan. I also explained they will check to be sure her doctors are included on this plan.  She verbalized appreciation.    ----- Message -----  From: Verdene Lennert, MD  Sent: 01/16/2020  7:43 PM EDT  To: Cecil Cranker Namari Breton, RD   Hello,   Ms. Gouger would really benefit from an GLP-1 or SGLT2i for renal/cardiac protection but Januvia was too expensive per month for her. Is there any way to price check which would be the cheapest with her insurance?    Thank you!   Verdene Lennert

## 2020-03-17 ENCOUNTER — Encounter: Payer: Self-pay | Admitting: Internal Medicine

## 2020-05-10 ENCOUNTER — Telehealth: Payer: Self-pay | Admitting: *Deleted

## 2020-05-10 ENCOUNTER — Telehealth: Payer: Self-pay | Admitting: Internal Medicine

## 2020-05-10 NOTE — Telephone Encounter (Signed)
Pls contact [phone] Annabelle Harman at [facility]

## 2020-05-10 NOTE — Telephone Encounter (Signed)
rtc to dana, [facility]. Lm for rtc

## 2020-05-10 NOTE — Telephone Encounter (Signed)
rtc to [facility]. They needed diag code for diab problem, given.

## 2020-05-27 ENCOUNTER — Ambulatory Visit: Payer: Self-pay | Admitting: *Deleted

## 2020-05-30 ENCOUNTER — Other Ambulatory Visit: Payer: Self-pay | Admitting: Internal Medicine

## 2020-05-30 DIAGNOSIS — I10 Essential (primary) hypertension: Secondary | ICD-10-CM

## 2020-05-30 NOTE — Telephone Encounter (Signed)
Left message for patient

## 2020-05-30 NOTE — Telephone Encounter (Signed)
Lst visit  03/17 Next appt -none scheduled  Will route to appropriate team for review and to front office for an appt.Regenia Skeeter, Kaleeyah Cuffie Cassady8/2/202111:02 AM

## 2020-06-01 ENCOUNTER — Other Ambulatory Visit: Payer: Self-pay | Admitting: *Deleted

## 2020-06-01 NOTE — Patient Outreach (Signed)
[facility]) Care Management Chronic Special Needs Program    06/01/2020  Name: Stephanie Davis, DOB: 1951/05/16  MRN: [id number]   [name] is enrolled in a chronic special needs plan for Diabetes.  Outreach call to client for initial telephone outreach.  No answer to telephone.    Goals    .   Acknowledge receipt of Educational psychologist mailed client Advanced Directives packet. RN care manager reviewed importance of having Advanced Directives forms completed. Plan to have Advanced Directives (Living Will and POA) forms notarized and witnessed. RN care manager mailed EMMI education article " Advanced Directives"    .  "lowering AIC by exercising, getting up more, being active" (pt-stated)      [employer] offers health coaching services for diet and exercise management, reinforcement.  Please let your RN care manager know if you are interested at [phone]. RN care provided EMMI education article " exercise" Talk with your doctor about exercise program    .  Blood Pressure < 140/90    .  Client understands the importance of follow-up with providers by attending scheduled visits    .  Client will report no worsening of symptoms related to heart disease within the next 9-12 months      Notify provider for symptoms of chest pain, sweating, nausea/ vomiting, irregular heartbeat, palpitations, rapid heart rate, shortness of breath, dizziness or fainting. Call 911 for severe symptoms of chest pain or shortness of breath. Take medications as prescribed. Follow a low salt meal plan, limit or avoid alcohol. Client reported no signs or of cardiac issues. Increase exercise only if you are able to do it.  Follow doctor recommendations. Follow up with your cardiologist (heart doctor) for yearly visits. EMMI education article provided "Heart disease in diabetes"  Review and plan to discuss with RN during next telephonic  assessment.  Use 24 hour nurse advice line as needed at [phone]     .  Client will verbalize knowledge of self management of Hypertension as evidences by BP reading of 140/90 or less; or as defined by provider      Plan to check blood pressure regularly.  If you do not have a B/P monitor (cuff), one can be provided to you.  Write results in your [employer] calendar (in the back section). Reviewed blood pressure medication from EMR. Take B/P medications as ordered.  Some may cause you to use the bathroom more. Plan to eat low salt and heart healthy meals full of fruits, vegetables, whole grains, lean protein and limit fat and sugars. Increase activity as tolerated.      Marland Kitchen  HEMOGLOBIN A1C < 7    .  LDL CALC < 100    .  Maintain timely refills of diabetic medication as prescribed within the year .      Contact your RN care manager if you have questions about medicines. Medication review completed from EMR information. It is important to take your medications as prescribed.      .  Obtain annual  Lipid Profile, LDL-C      Per medical record review, Lipid profile completed on 07/08/19 LDL= unable to determine from electronic medical record. The goal for LDL is less than 70mg /dl as you are at high risk for complications. Try to avoid saturated fats, trans-fats and eat more fiber. Plan to take statin (cholesterol) medicine as ordered.       Obtain Annual Eye (retinal)  Exam       Your last documented eye exam was on 02/04/18 Diabetes can affect your vision.  Plan to have a dilated eye exam every year. Advised client to keep and/ or schedule appointment with eye doctor. Continue to use your eye drops as prescribed.      .  Obtain Annual Foot Exam      Your doctor should check your bare feet at each visit. Diabetes can affect the nerves in your feet, causing decreased feeling or numbness. Check your feet and in-between toes daily for cuts, bruises, redness, blisters  or sores.  If you cannot reach them, use a mirror. Wash feet with soap and water, dry feet well especially between toes.  Don't use too much lotion. Wear shoes that are not too tight and don't walk barefoot.     .  Obtain annual screen for micro albuminuria (urine) , nephropathy (kidney problems)      Diabetes can affect your kidneys. It is important for your doctor to check your urine at least once a year  These tests show how your kidneys are working.     .  Obtain Hemoglobin A1C at least 2 times per year      Your last documented AIC is  7.9 on 01/13/20.  Have your [facility] checked every 6 months if you are at goal or every 3 months if you are not at goal. Check blood sugars daily before eating with goal of 80-130.  You can also check 1 1/2 hours after eating with goal of 180 or less. Plan to eat low carbohydrate and low salt meals, watch portion sizes and avoid sugar sweetened drinks.  Discussed carbohydrate control meals. Reviewed signs and symptoms of hyperglycemia (high blood sugar) and hypoglycemia (low blood sugar) and actions to take. Review [employer] calendar (sent in the mail) for diabetes action plan in the back. Reviewed nutrition counseling benefit provided by [employer].   Increase activity only if you are able to do it.  Follow doctor recommendations. EMMI education provided on "Diabetes and Diet" .  Review and plan to discuss with RN during next telephonic assessment.       .  Visit Primary Care Provider or Endocrinologist at least 2 times per year       Client has seen primary care provider at least twice in 2021 Continue to follow up with your providers       PLAN RN care manager mailed unsuccessful outreach letter to client along with individualized care plan and education materials. RN care manager faxed today's note with individualized care plan to primary care provider. Outreach client within 1-2 weeks  Irving Shows [facility], BSN [facility] RN Care  Coordinator, CSNP [phone]

## 2020-06-02 ENCOUNTER — Ambulatory Visit (INDEPENDENT_AMBULATORY_CARE_PROVIDER_SITE_OTHER): Payer: HMO | Admitting: Internal Medicine

## 2020-06-02 ENCOUNTER — Telehealth: Payer: Self-pay | Admitting: Dietician

## 2020-06-02 ENCOUNTER — Encounter: Payer: Self-pay | Admitting: Internal Medicine

## 2020-06-02 ENCOUNTER — Other Ambulatory Visit: Payer: Self-pay

## 2020-06-02 VITALS — BP 131/63 | HR 66 | Temp 99.1°F | Ht 64.0 in | Wt 132.5 lb

## 2020-06-02 DIAGNOSIS — E1122 Type 2 diabetes mellitus with diabetic chronic kidney disease: Secondary | ICD-10-CM

## 2020-06-02 DIAGNOSIS — Z1231 Encounter for screening mammogram for malignant neoplasm of breast: Secondary | ICD-10-CM

## 2020-06-02 DIAGNOSIS — I129 Hypertensive chronic kidney disease with stage 1 through stage 4 chronic kidney disease, or unspecified chronic kidney disease: Secondary | ICD-10-CM | POA: Diagnosis not present

## 2020-06-02 DIAGNOSIS — I10 Essential (primary) hypertension: Secondary | ICD-10-CM

## 2020-06-02 DIAGNOSIS — N1832 Chronic kidney disease, stage 3b: Secondary | ICD-10-CM

## 2020-06-02 DIAGNOSIS — E1121 Type 2 diabetes mellitus with diabetic nephropathy: Secondary | ICD-10-CM | POA: Diagnosis not present

## 2020-06-02 DIAGNOSIS — Z794 Long term (current) use of insulin: Secondary | ICD-10-CM | POA: Diagnosis not present

## 2020-06-02 LAB — POCT GLYCOSYLATED HEMOGLOBIN (HGB A1C): Hemoglobin A1C: 8.9 % — AB (ref 4.0–5.6)

## 2020-06-02 LAB — GLUCOSE, CAPILLARY: Glucose-Capillary: 119 mg/dL — ABNORMAL HIGH (ref 70–99)

## 2020-06-02 MED ORDER — ONETOUCH VERIO VI STRP: ORAL_STRIP | 3 refills | Status: DC

## 2020-06-02 MED ORDER — ONETOUCH DELICA PLUS LANCET33G MISC: 3 refills | Status: DC

## 2020-06-02 MED ORDER — HYDROCHLOROTHIAZIDE 25 MG PO TABS: ORAL_TABLET | ORAL | 3 refills | Status: DC

## 2020-06-02 MED ORDER — XULTOPHY 100-3.6 UNIT-MG/ML ~~LOC~~ SOPN: 16.0000 [IU] | PEN_INJECTOR | Freq: Every day | SUBCUTANEOUS | 0 refills | Status: DC

## 2020-06-02 NOTE — Progress Notes (Signed)
   CC: HTN and diabetes follow up  HPI:  [name] is a 69 y.o. with a PMHx of HTN, T2DM, CKD3a who presents to the clinic for HTN and diabetes follow up.   Please see the Encounters tab for problem-based Assessment & Plan regarding status of patient's acute and chronic conditions.  Past Medical History:  Diagnosis Date  . CKD (chronic kidney disease), stage III   . Diabetes mellitus   . History of depression   . Hyperlipidemia   . Hyperparathyroidism    ? h/o hyperCa, no PTH level in chart, uptake study was negative; did not have intervention  . Hypertension    Review of Systems: Review of Systems  Constitutional: Negative for chills, fever and weight loss.  Respiratory: Negative for cough and shortness of breath.   Cardiovascular: Negative for chest pain and palpitations.  Gastrointestinal: Negative for abdominal pain, diarrhea, nausea and vomiting.  Neurological: Negative for dizziness, focal weakness and headaches.   Physical Exam:  Vitals:   06/02/20 1339  BP: 131/63  Pulse: 66  Temp: 99.1 F (37.3 C)  TempSrc: Oral  SpO2: 100%  Weight: 132 lb 8 oz (60.1 kg)  Height: 5\' 4"  (1.626 m)    Physical Exam Vitals and nursing note reviewed.  Constitutional:      General: She is not in acute distress.    Appearance: She is normal weight.  Cardiovascular:     Rate and Rhythm: Normal rate and regular rhythm.     Heart sounds: No murmur heard.   Pulmonary:     Effort: Pulmonary effort is normal. No respiratory distress.     Breath sounds: Normal breath sounds. No wheezing or rales.  Musculoskeletal:     Right lower leg: No edema.     Left lower leg: No edema.  Skin:    General: Skin is warm and dry.  Neurological:     General: No focal deficit present.     Mental Status: She is alert and oriented to person, place, and time.  Psychiatric:        Mood and Affect: Mood normal.        Behavior: Behavior normal.    Assessment & Plan:   See Encounters  Tab for problem based charting.  Patient discussed with Dr. 

## 2020-06-02 NOTE — Telephone Encounter (Signed)
Called [facility] per patient request about pen needles not being covered by her insurance. They are now covered at 100%. Patient informed.

## 2020-06-02 NOTE — Patient Instructions (Addendum)
It was nice seeing you today! Thank you for choosing [facility] for your Primary Care.    Today we talked about:   1. Diabetes: Continue taking your Metformin as you have been. We started you on a new medication called Xultophy. Inject 16 units once per day only. Stop using Novolog. If you experience severe GI upset, diarrhea, vomiting, please stop the medication and call the clinic.  2. Continue your blood pressure medication as you have been 3. Please schedule your Mammogram and contact the eye doctor

## 2020-06-03 LAB — BMP8+ANION GAP
Anion Gap: 12 mmol/L (ref 10.0–18.0)
BUN/Creatinine Ratio: 10 — ABNORMAL LOW (ref 12–28)
BUN: 15 mg/dL (ref 8–27)
CO2: 24 mmol/L (ref 20–29)
Calcium: 9.3 mg/dL (ref 8.7–10.3)
Chloride: 105 mmol/L (ref 96–106)
Creatinine, Ser: 1.43 mg/dL — ABNORMAL HIGH (ref 0.57–1.00)
GFR calc Af Amer: 44 mL/min/{1.73_m2} — ABNORMAL LOW (ref >59)
GFR calc non Af Amer: 38 mL/min/{1.73_m2} — ABNORMAL LOW (ref >59)
Glucose: 93 mg/dL (ref 65–99)
Potassium: 5.1 mmol/L (ref 3.5–5.2)
Sodium: 141 mmol/L (ref 134–144)

## 2020-06-06 NOTE — Assessment & Plan Note (Signed)
BP Readings from Last 3 Encounters:  06/02/20 131/63  01/13/20 (!) 145/86  07/08/19 (!) 162/80   [name] states she has been taking her lisinopril-HCTZ and diltiazem as prescribed.  She denies any chest pain, even with exertion and at rest, shortness of breath, dizziness, vision changes.  Assessment/plan: Well-controlled at this time.  No medication changes planned for today  -Continue lisinopril 40 mg daily, HCTZ 25 mg daily, diltiazem 180 mg daily

## 2020-06-06 NOTE — Assessment & Plan Note (Addendum)
Lab Results  Component Value Date   HGBA1C 8.9 (A) 06/02/2020   [name] states that she has been taking her NovoLog 70/30 18 units twice daily.  She notes that her diet has been stable but she continues to eat daily ice cream.  She denies any episodes of hypoglycemia.  Assessment/plan: [name] continues to be relatively uncontrolled at this time, given her A1c goal is of at least 8.  I feel like this is likely secondary to dietary discretion.  Patient brought in her glucometer with her today that shows no episodes of hypoglycemia in CBG range of 140-180.  Patient would benefit from the addition of another agent but is hesitant to add on more to her regimen.  At this time, will switch from NovoLog 70/30 to [facility] for his combination therapy.  We will start at starting dose and uptitrate.  BMP was evaluated at this visit as well and shows stable CKD.  -Continue Metformin -Discontinue NovoLog 70/30 -Xultophy 16 units daily at bedtime -Continue monitoring blood glucose at least twice daily

## 2020-06-06 NOTE — Progress Notes (Signed)
Internal Medicine Clinic Attending  Case discussed with Dr. Basaraba  At the time of the visit.  We reviewed the resident's history and exam and pertinent patient test results.  I agree with the assessment, diagnosis, and plan of care documented in the resident's note.  

## 2020-06-13 ENCOUNTER — Other Ambulatory Visit: Payer: Self-pay | Admitting: *Deleted

## 2020-06-13 NOTE — Patient Outreach (Signed)
°  [facility]) Care Management Chronic Special Needs Program    06/13/2020  Name: GAYNA BRADDY, DOB: [birth year]  MRN: [id number]   Ms. Rozina Pointer is enrolled in a chronic special needs plan for Diabetes.  Outreach call to client for initial telephone assessment/  2nd attempt,  No answer to telephone and no option to leave voicemail. Client called RN care manager back and states she is driving in the car, requests a call back on another day this week if possible  PLAN Outreach client this week  Irving Shows [facility], BSN [facility] RN Care Coordinator, CSNP [phone]

## 2020-06-16 ENCOUNTER — Ambulatory Visit: Payer: Self-pay | Admitting: *Deleted

## 2020-06-17 ENCOUNTER — Other Ambulatory Visit: Payer: Self-pay | Admitting: *Deleted

## 2020-06-17 ENCOUNTER — Encounter: Payer: Self-pay | Admitting: *Deleted

## 2020-06-17 NOTE — Patient Outreach (Signed)
[facility]) Care Management Chronic Special Needs Program  06/17/2020  Name: Stephanie Davis DOB: [birth year]  MRN: [id number]  Ms. Sibel Khurana is enrolled in a chronic special needs plan for Diabetes. Chronic Care Management Coordinator telephoned client to review health risk assessment and to develop individualized care plan.  Introduced the chronic care management program, importance of client participation, and taking their care plan to all provider appointments and inpatient facilities.  Reviewed the transition of care process and possible referral to community care management.  Subjective: Spoke with client who reports she is not at home but is able to talk.  Client reports she lives with spouse and she is independent in all aspects of her care, still drives.  Client states her personal care goal is still to lower AIC by exercising and being more active.  Client declines health coaching and states she walks and works in her garden.  Client reports she does not follow any particular diet. Client states on health risk assessment that she has memory issues, upon questioning, client states it is everyday things "like everyone has such as forgetting where I put my keys sometimes".  Client states she will look for Advanced directives packet and has not yet decided on completing these documents.  Client states she will be making appointment for eye exam.    Goals Addressed              This Visit's Progress   .   Acknowledge receipt of Corporate investment banker reminded client that Advanced directives packet was mailed Client is not at home at present, but will check and make sure she has the packet Please call RN care manager if any questions at [phone]    .  "lowering AIC by exercising, getting up more, being active" (pt-stated)        [employer] offers health coaching services for diet and exercise management, reinforcement.   Please let your RN care manager know if you are interested at [phone]. Talk with your doctor about exercise program    .  Blood Pressure < 140/90      .  Client understands the importance of follow-up with providers by attending scheduled visits        Client reports she is attending all health care provider appointments    .  Client will report no worsening of symptoms related to heart disease within the next 9-12 months        Notify provider for symptoms of chest pain, sweating, nausea/ vomiting, irregular heartbeat, palpitations, rapid heart rate, shortness of breath, dizziness or fainting. Call 911 for severe symptoms of chest pain or shortness of breath. Take medications as prescribed. Follow a low salt meal plan, limit or avoid alcohol. Client reported no signs or of cardiac issues. Increase exercise only if you are able to do it.  Follow doctor recommendations. Follow up with your cardiologist (heart doctor) for yearly visits. Use 24 hour nurse advice line as needed at [phone]     .  HEMOGLOBIN A1C < 7        Your last documented AIC is  7.9.  Have your [facility] checked every 6 months if you are at goal or every 3 months if you are not at goal. Check blood sugars daily before eating with goal of 80-130.  You can also check 1 1/2 hours after eating with goal of 180 or less. Plan  to eat low carbohydrate and low salt meals, watch portion sizes and avoid sugar sweetened drinks.  Discussed carbohydrate control meals. Reviewed signs and symptoms of hyperglycemia (high blood sugar) and hypoglycemia (low blood sugar) and actions to take. Review [employer] calendar (sent in the mail) for diabetes action plan in the back. Reviewed nutrition counseling benefit provided by [employer].  Will refer to [facility] Health Coach to assist with dietary management of diabetes.  Increase activity only if you are able to do it.  Follow doctor recommendations.      .  Obtain  Hemoglobin A1C at least 2 times per year        Client reports [facility] is checked at least twice yearly         PLAN:    RN care manager faxed today's note and updated individualized care plan to primary care provider , mailed updated individualized care plan to client.  Chronic care management coordination will outreach in:  9-12 months    Audrie Gallus Nursing/RN Coord [facility] Case Manager, C-SNP  [phone]

## 2020-06-22 ENCOUNTER — Telehealth: Payer: Self-pay | Admitting: Internal Medicine

## 2020-06-22 NOTE — Telephone Encounter (Signed)
Contacted Ms. Tschetter this morning to see how she is doing with her medication change, which includes discontinuing NovoLog 70/30 and starting Xultophy.  She states she is doing great is not having any adverse side effects.  I specifically asked her in regards to weight loss, she states she has not noticed any weight loss.  I requested that she follow-up in the next 1 to 2 weeks to check on her weight and follow-up in person.

## 2020-07-07 ENCOUNTER — Encounter: Payer: HMO | Admitting: Internal Medicine

## 2020-07-25 ENCOUNTER — Other Ambulatory Visit: Payer: Self-pay

## 2020-07-25 ENCOUNTER — Encounter: Payer: Self-pay | Admitting: Internal Medicine

## 2020-07-25 ENCOUNTER — Ambulatory Visit (INDEPENDENT_AMBULATORY_CARE_PROVIDER_SITE_OTHER): Payer: HMO | Admitting: Internal Medicine

## 2020-07-25 VITALS — BP 128/91 | HR 65 | Temp 98.2°F | Ht 64.0 in | Wt 124.1 lb

## 2020-07-25 DIAGNOSIS — I129 Hypertensive chronic kidney disease with stage 1 through stage 4 chronic kidney disease, or unspecified chronic kidney disease: Secondary | ICD-10-CM

## 2020-07-25 DIAGNOSIS — E1122 Type 2 diabetes mellitus with diabetic chronic kidney disease: Secondary | ICD-10-CM

## 2020-07-25 DIAGNOSIS — Z794 Long term (current) use of insulin: Secondary | ICD-10-CM | POA: Diagnosis not present

## 2020-07-25 DIAGNOSIS — N183 Chronic kidney disease, stage 3 unspecified: Secondary | ICD-10-CM

## 2020-07-25 DIAGNOSIS — Z23 Encounter for immunization: Secondary | ICD-10-CM

## 2020-07-25 DIAGNOSIS — I10 Essential (primary) hypertension: Secondary | ICD-10-CM

## 2020-07-25 NOTE — Assessment & Plan Note (Signed)
Hypertension: Well-controlled  BP Readings from Last 3 Encounters:  07/25/20 (!) 128/91  06/02/20 131/63  01/13/20 (!) 145/86   Plan: -Continue diltiazem 180 mg daily -Continue hydrochlorothiazide 25 mg daily -Continue lisinopril 40 mg daily

## 2020-07-25 NOTE — Progress Notes (Signed)
Internal Medicine Clinic Attending  Case discussed with Dr. Agyei  At the time of the visit.  We reviewed the resident's history and exam and pertinent patient test results.  I agree with the assessment, diagnosis, and plan of care documented in the resident's note.  

## 2020-07-25 NOTE — Assessment & Plan Note (Signed)
Type 2 diabetes mellitus: Her last A1c was 8.9% when checked last month.  During her last visit, she was instructed to discontinue NovoLog 70/30 and started on Xultophy.  Since then, she has been doing very well and her average blood glucose is 139 mg/dL.  Lowest blood sugar was 78 mg/dL and highest was 421 mg/dL.  Plan: -Continue Metformin -Continue Xultophy 16 units daily -Return to clinic in 2 months for repeat A1c

## 2020-07-25 NOTE — Patient Instructions (Signed)
Ms. Cogle.   Thank you for seeing [place] today.  You are doing well with your new diabetes regimen I would encourage you to continue taking your medications.   We will see you back in clinic in 2 months.

## 2020-07-25 NOTE — Progress Notes (Signed)
   CC: Follow-up hypertension, diabetes mellitus  HPI:  [name] is a 69 y.o. with medical history listed below presented to follow-up on chronic medical problems.  Please see problem based charting for further details.   Past Medical History:  Diagnosis Date  . CKD (chronic kidney disease), stage III   . Diabetes mellitus   . History of depression   . Hyperlipidemia   . Hyperparathyroidism    ? h/o hyperCa, no PTH level in chart, uptake study was negative; did not have intervention  . Hypertension    Review of Systems:  As per HPI  Physical Exam:  Vitals:   07/25/20 0920  BP: (!) 159/81  Pulse: 66  Temp: 98.2 F (36.8 C)  TempSrc: Oral  SpO2: 100%  Weight: 124 lb 1.6 oz (56.3 kg)  Height: 5\' 4"  (1.626 m)   Physical Exam Vitals reviewed.  Cardiovascular:     Rate and Rhythm: Normal rate.     Heart sounds: No murmur heard.   Pulmonary:     Breath sounds: No rales.  Neurological:     Mental Status: She is alert.     Assessment & Plan:   See Encounters Tab for problem based charting.  Patient discussed with Dr. 

## 2020-08-29 ENCOUNTER — Encounter: Payer: Self-pay | Admitting: *Deleted

## 2020-08-29 NOTE — Progress Notes (Signed)

## 2020-08-30 ENCOUNTER — Other Ambulatory Visit: Payer: Self-pay | Admitting: Internal Medicine

## 2020-08-30 DIAGNOSIS — I10 Essential (primary) hypertension: Secondary | ICD-10-CM

## 2020-08-31 NOTE — Progress Notes (Signed)
Things That May Be Affecting Your Health:  Alcohol  Hearing loss  Pain    Depression  Home Safety  Sexual Health  X Diabetes  Lack of physical activity  Stress   Difficulty with daily activities  Loneliness  Tiredness   Drug use  Medicines  Tobacco use   Falls  Motor Vehicle Safety X Weight   Food choices  Oral Health  Other    YOUR PERSONALIZED HEALTH PLAN : 1. Schedule your next subsequent Medicare Wellness visit in one year 2. Attend all of your regular appointments to address your medical issues 3. Complete the preventative screenings and services   Annual Wellness Visit   Medicare Covered Preventative Screenings and Services  Services & Screenings Men and Women Who How Often Need? Date of Last Service Action  Abdominal Aortic Aneurysm Adults with AAA risk factors Once     Alcohol Misuse and Counseling All Adults Screening once a year if no alcohol misuse. Counseling up to 4 face to face sessions.     Bone Density Measurement  Adults at risk for osteoporosis Once every 2 yrs X 11/21/2006   Lipid Panel Z13.6 All adults without CV disease Once every 5 yrs     Colorectal Cancer   Stool sample or  Colonoscopy All adults 50 and older   Once every year  Every 10 years X 06/16/2003   Depression All Adults Once a year  Today   Diabetes Screening Blood glucose, post glucose load, or GTT Z13.1  All adults at risk  Pre-diabetics  Once per year  Twice per year     Diabetes  Self-Management Training All adults Diabetics 10 hrs first year; 2 hours subsequent years. Requires Copay     Glaucoma  Diabetics  Family history of glaucoma  African Americans 50 yrs +  Hispanic Americans 65 yrs + Annually - requires coppay X 02/14/2018   Hepatitis C Z72.89 or F19.20  High Risk for HCV  Born between 1945 and 1965  Annually  Once     HIV Z11.4 All adults based on risk  Annually btw ages 59 & 58 regardless of risk  Annually > 65 yrs if at increased risk     Lung Cancer  Screening Asymptomatic adults aged 14-77 with 30 pack yr history and current smoker OR quit within the last 15 yrs Annually Must have counseling and shared decision making documentation before first screen     Medical Nutrition Therapy Adults with   Diabetes  Renal disease  Kidney transplant within past 3 yrs 3 hours first year; 2 hours subsequent years     Obesity and Counseling All adults Screening once a year Counseling if BMI 30 or higher  Today   Tobacco Use Counseling Adults who use tobacco  Up to 8 visits in one year     Vaccines Z23  Hepatitis B  Influenza   Pneumonia  Adults   Once  Once every flu season  Two different vaccines separated by one year     Next Annual Wellness Visit People with Medicare Every year  Today     Services & Screenings Women Who How Often Need  Date of Last Service Action  Mammogram  Z12.31 Women over 40 One baseline ages 36-39. Annually ager 40 yrs+ X 06/20/2012   Pap tests All women Annually if high risk. Every 2 yrs for normal risk women     Screening for cervical cancer with   Pap (Z01.419 nl or Z01.411abnl) &  HPV Z11.51 Women aged 51 to 43 Once every 5 yrs     Screening pelvic and breast exams All women Annually if high risk. Every 2 yrs for normal risk women     Sexually Transmitted Diseases  Chlamydia  Gonorrhea  Syphilis All at risk adults Annually for non pregnant females at increased risk         [place] Men Who How Ofter Need  Date of Last Service Action  Prostate Cancer - DRE & PSA Men over 50 Annually.  DRE might require a copay.     Sexually Transmitted Diseases  Syphilis All at risk adults Annually for men at increased risk

## 2020-09-06 ENCOUNTER — Other Ambulatory Visit: Payer: Self-pay | Admitting: Internal Medicine

## 2020-09-06 DIAGNOSIS — N1832 Chronic kidney disease, stage 3b: Secondary | ICD-10-CM

## 2020-09-06 DIAGNOSIS — Z794 Long term (current) use of insulin: Secondary | ICD-10-CM

## 2020-09-15 ENCOUNTER — Ambulatory Visit: Payer: HMO | Admitting: Internal Medicine

## 2020-09-15 ENCOUNTER — Other Ambulatory Visit: Payer: Self-pay

## 2020-10-04 ENCOUNTER — Ambulatory Visit (INDEPENDENT_AMBULATORY_CARE_PROVIDER_SITE_OTHER): Payer: HMO | Admitting: Internal Medicine

## 2020-10-04 ENCOUNTER — Other Ambulatory Visit: Payer: Self-pay

## 2020-10-04 ENCOUNTER — Encounter: Payer: Self-pay | Admitting: Internal Medicine

## 2020-10-04 VITALS — BP 133/75 | HR 72 | Temp 98.0°F | Ht 64.0 in | Wt 122.2 lb

## 2020-10-04 DIAGNOSIS — I129 Hypertensive chronic kidney disease with stage 1 through stage 4 chronic kidney disease, or unspecified chronic kidney disease: Secondary | ICD-10-CM

## 2020-10-04 DIAGNOSIS — Z0000 Encounter for general adult medical examination without abnormal findings: Secondary | ICD-10-CM

## 2020-10-04 DIAGNOSIS — E1122 Type 2 diabetes mellitus with diabetic chronic kidney disease: Secondary | ICD-10-CM | POA: Diagnosis not present

## 2020-10-04 DIAGNOSIS — N183 Chronic kidney disease, stage 3 unspecified: Secondary | ICD-10-CM | POA: Diagnosis not present

## 2020-10-04 DIAGNOSIS — I10 Essential (primary) hypertension: Secondary | ICD-10-CM

## 2020-10-04 DIAGNOSIS — Z794 Long term (current) use of insulin: Secondary | ICD-10-CM

## 2020-10-04 DIAGNOSIS — E782 Mixed hyperlipidemia: Secondary | ICD-10-CM | POA: Diagnosis not present

## 2020-10-04 LAB — POCT GLYCOSYLATED HEMOGLOBIN (HGB A1C): Hemoglobin A1C: 8.5 % — AB (ref 4.0–5.6)

## 2020-10-04 LAB — GLUCOSE, CAPILLARY: Glucose-Capillary: 156 mg/dL — ABNORMAL HIGH (ref 70–99)

## 2020-10-04 MED ORDER — DAPAGLIFLOZIN PROPANEDIOL 5 MG PO TABS: 5.0000 mg | ORAL_TABLET | Freq: Every day | ORAL | 2 refills | Status: DC

## 2020-10-04 NOTE — Assessment & Plan Note (Signed)
Health maintenance:  -States that she currently does not want the colonoscopy or the stool immunohistochemical -DEXA scan has been ordered -State that she like for [place] to revisit the pneumonia vaccine in the Covid vaccine

## 2020-10-04 NOTE — Progress Notes (Signed)
   CC: Follow-up hypertension  HPI:  [name] is a 69 y.o. with medical history listed below presenting to follow-up on chronic medical problems.  Please see problem based charting for further details.  Past Medical History:  Diagnosis Date  . CKD (chronic kidney disease), stage III (HCC)   . Diabetes mellitus   . History of depression   . Hyperlipidemia   . Hyperparathyroidism    ? h/o hyperCa, no PTH level in chart, uptake study was negative; did not have intervention  . Hypertension    Review of Systems:  AS per HPI   Physical Exam:  Vitals:   10/04/20 1056  BP: 133/75  Pulse: 72  Temp: 98 F (36.7 C)  TempSrc: Oral  SpO2: 100%  Height: 5\' 4"  (1.626 m)   Physical Exam Vitals and nursing note reviewed.  Eyes:     Conjunctiva/sclera: Conjunctivae normal.  Cardiovascular:     Rate and Rhythm: Normal rate.     Heart sounds: Normal heart sounds. No murmur heard.   Pulmonary:     Effort: No respiratory distress.     Breath sounds: Normal breath sounds.  Neurological:     Mental Status: She is alert.  Psychiatric:        Mood and Affect: Mood normal.        Behavior: Behavior normal.     Assessment & Plan:   See Encounters Tab for problem based charting.  Patient discussed with Dr. 

## 2020-10-04 NOTE — Assessment & Plan Note (Signed)
Type 2 diabetes mellitus: Her last A1c was 8.9%.  A1c today 8.5% her home glucose summary shows below target 4.7%, in target 48%, above target 46%.  Her fasting glucose has ranged in the 70s-130s.  Plan: -Continue Metformin 1000 mg twice daily -Continue Xultophy 16 units daily -Add Farxiga 5 mg daily -Return to clinic in 3 months for follow-up

## 2020-10-04 NOTE — Assessment & Plan Note (Signed)
Hypertension: Well-controlled.  BP Readings from Last 3 Encounters:  10/04/20 133/75  07/25/20 (!) 128/91  06/02/20 131/63   Plan: -Continue diltiazem 180 mg daily, hydrochlorothiazide 25 mg daily, lisinopril 40 mg daily

## 2020-10-04 NOTE — Patient Instructions (Signed)
Stephanie Davis,  Thanks for seeing me today.  Your hemoglobin A1c is a little better at 8.5% from 8.9%.  But is still not the best control.  I am adding another diabetes medicine called [place].  You take 5 mg daily.  We will see you back in 3 months for diabetes recheck.  Take care! Dr. Dortha Schwalbe  Please call the internal medicine center clinic if you have any questions or concerns, we may be able to help and keep you from a long and expensive emergency room wait. Our clinic and after hours phone number is [phone], the best time to call is Monday through Friday 9 am to 4 pm but there is always someone available 24/7 if you have an emergency. If you need medication refills please notify your pharmacy one week in advance and they will send [place] a request.   If you have not gotten the COVID vaccine, I recommend doing so:  You may get it at your local [facility] or [facility] OR To schedule an appointment for a COVID vaccine or be added to the vaccine wait list: Go to [email or web address]   OR Go to [email or web address]                  OR Call [phone]                                     OR Call [phone] and select Option 2

## 2020-10-05 LAB — LIPID PANEL
Chol/HDL Ratio: 1.9 ratio (ref 0.0–4.4)
Cholesterol, Total: 63 mg/dL — ABNORMAL LOW (ref 100–199)
HDL: 33 mg/dL — ABNORMAL LOW (ref >39)
LDL Chol Calc (NIH): 15 mg/dL (ref 0–99)
Triglycerides: 61 mg/dL (ref 0–149)
VLDL Cholesterol Cal: 15 mg/dL (ref 5–40)

## 2020-10-05 NOTE — Progress Notes (Signed)
Internal Medicine Clinic Attending  Case discussed with Dr. Agyei  At the time of the visit.  We reviewed the resident's history and exam and pertinent patient test results.  I agree with the assessment, diagnosis, and plan of care documented in the resident's note.  

## 2020-10-06 MED ORDER — EMPAGLIFLOZIN 10 MG PO TABS: 10.0000 mg | ORAL_TABLET | Freq: Every day | ORAL | 3 refills | Status: DC

## 2020-10-06 NOTE — Addendum Note (Signed)
Addended by: Yvette Rack on: 10/06/2020 11:05 AM   Modules accepted: Orders

## 2020-10-14 ENCOUNTER — Other Ambulatory Visit: Payer: Self-pay | Admitting: *Deleted

## 2020-10-14 NOTE — Patient Outreach (Signed)
  [facility]) Care Management Chronic Special Needs Program    10/14/2020  Name: ENISA RUNYAN, DOB: [birth year]  MRN: [id number]   Ms. Aleiyah Halpin is enrolled in a chronic special needs plan for Diabetes.  [facility] care management will continue to provide services for this member through 10/28/20.  The [employer] care management team will assume care 10/29/20.  Irving Shows [facility], BSN [facility] RN Care Coordinator, CSNP [phone]

## 2020-11-01 ENCOUNTER — Other Ambulatory Visit: Payer: Self-pay | Admitting: Internal Medicine

## 2020-11-01 DIAGNOSIS — I10 Essential (primary) hypertension: Secondary | ICD-10-CM

## 2020-11-02 ENCOUNTER — Telehealth: Payer: Self-pay

## 2020-11-02 NOTE — Telephone Encounter (Signed)
Please call pt back about meds.  

## 2020-11-02 NOTE — Telephone Encounter (Signed)
Pt was given an appt for 01/11 to address right hand shaking

## 2020-11-02 NOTE — Telephone Encounter (Signed)
Returned call to patient. States she wants to try stopping her diltiazem. States she started this med last May and in September began having muscle weakness and intermittent hands shaking. Patient has already been given tele appt for 11/08/2020 to discuss this. Advised her not to stop the med before discussing with Provider. She agrees. Kinnie Feil, BSN, RN-BC

## 2020-11-02 NOTE — Telephone Encounter (Signed)
Yes, would have her continue medication until follow-up. I think she may need an in person appointment, though.

## 2020-11-03 ENCOUNTER — Other Ambulatory Visit: Payer: Self-pay | Admitting: *Deleted

## 2020-11-03 NOTE — Telephone Encounter (Signed)
Patient notified of below. She will come to [facility] on 11/08/2020 at 2:15. Kinnie Feil, BSN, RN-BC

## 2020-11-08 ENCOUNTER — Encounter: Payer: HMO | Admitting: Internal Medicine

## 2020-11-16 ENCOUNTER — Encounter: Payer: HMO | Admitting: Internal Medicine

## 2020-11-23 ENCOUNTER — Encounter: Payer: Self-pay | Admitting: Internal Medicine

## 2020-11-23 ENCOUNTER — Ambulatory Visit (INDEPENDENT_AMBULATORY_CARE_PROVIDER_SITE_OTHER): Payer: HMO | Admitting: Internal Medicine

## 2020-11-23 ENCOUNTER — Other Ambulatory Visit: Payer: Self-pay

## 2020-11-23 VITALS — BP 135/74 | HR 82 | Temp 98.6°F | Ht 64.0 in | Wt 118.9 lb

## 2020-11-23 DIAGNOSIS — Z8659 Personal history of other mental and behavioral disorders: Secondary | ICD-10-CM | POA: Diagnosis not present

## 2020-11-23 DIAGNOSIS — R251 Tremor, unspecified: Secondary | ICD-10-CM | POA: Diagnosis not present

## 2020-11-23 NOTE — Progress Notes (Signed)
   CC: right hand tremor  HPI:  Stephanie Davis is a 70 y.o. female with PMHx as stated below presenting for evaluation of right hand tremor for 3 months. Patient is accompanied by daughter who assists with providing the history. Patient has had intermittent right fourth and fifth digit tremors that have sometimes generalized to the hand. Denies any associated pain or paresthesias or triggers. Notes that this lasts for few minutes and spontaneously resolves. Please see problem based charting for complete assessment and plan.   Past Medical History:  Diagnosis Date  . CKD (chronic kidney disease), stage III (HCC)   . Diabetes mellitus   . History of depression   . Hyperlipidemia   . Hyperparathyroidism    ? h/o hyperCa, no PTH level in chart, uptake study was negative; did not have intervention  . Hypertension    Review of Systems:  Negative except as stated in HPI.  Physical Exam:  Vitals:   11/23/20 1010  BP: 135/74  Pulse: 82  Temp: 98.6 F (37 C)  TempSrc: Oral  SpO2: 100%  Weight: 118 lb 14.4 oz (53.9 kg)  Height: 5\' 4"  (1.626 m)   Physical Exam  Constitutional: Thin appearing elderly female; No distress.  HENT: Normocephalic and atraumatic, EOMI, conjunctiva normal, moist mucous membranes Cardiovascular: Normal rate, regular rhythm, S1 and S2 present, no murmurs, rubs, gallops.  Distal pulses intact Respiratory: No respiratory distress, no accessory muscle use.  Effort is normal.  Lungs are clear to auscultation bilaterally. Musculoskeletal: Normal bulk and tone.  No peripheral edema noted. No bony tenderness noted  Neurological: Is alert and oriented x4, no apparent focal deficits noted. Skin: Warm and dry.  No rash, erythema, lesions noted.  Assessment & Plan:   See Encounters Tab for problem based charting.  Patient discussed with Dr. 

## 2020-11-23 NOTE — Patient Instructions (Signed)
Ms Calais Svehla,  It was a pleasure seeing you in clinic. Today we discussed:   Hand tremor:  I am checking on some lab work and will call you with any abnormal results.   Diabetes: Continue to take your medications as prescribed. As discussed, I would recommend trying to incorporate more protein into your diet.   Grief: I am so sorry about your husband passing away. This is a tough time and we are here if you need anything at all.   If you have any questions or concerns, please call our clinic at [phone] between 9am-5pm and after hours call [phone] and ask for the internal medicine resident on call. If you feel you are having a medical emergency please call 911.   Thank you, we look forward to helping you remain healthy!

## 2020-11-24 DIAGNOSIS — R251 Tremor, unspecified: Secondary | ICD-10-CM

## 2020-11-24 HISTORY — DX: Tremor, unspecified: R25.1

## 2020-11-24 LAB — CMP14 + ANION GAP
ALT: 78 IU/L — ABNORMAL HIGH (ref 0–32)
AST: 73 IU/L — ABNORMAL HIGH (ref 0–40)
Albumin/Globulin Ratio: 1.4 (ref 1.2–2.2)
Albumin: 4 g/dL (ref 3.8–4.8)
Alkaline Phosphatase: 149 IU/L — ABNORMAL HIGH (ref 44–121)
Anion Gap: 18 mmol/L (ref 10.0–18.0)
BUN/Creatinine Ratio: 17 (ref 12–28)
BUN: 25 mg/dL (ref 8–27)
Bilirubin Total: <0.2 mg/dL (ref 0.0–1.2)
CO2: 19 mmol/L — ABNORMAL LOW (ref 20–29)
Calcium: 9.4 mg/dL (ref 8.7–10.3)
Chloride: 104 mmol/L (ref 96–106)
Creatinine, Ser: 1.43 mg/dL — ABNORMAL HIGH (ref 0.57–1.00)
GFR calc Af Amer: 44 mL/min/{1.73_m2} — ABNORMAL LOW (ref >59)
GFR calc non Af Amer: 38 mL/min/{1.73_m2} — ABNORMAL LOW (ref >59)
Globulin, Total: 2.8 g/dL (ref 1.5–4.5)
Glucose: 143 mg/dL — ABNORMAL HIGH (ref 65–99)
Potassium: 4 mmol/L (ref 3.5–5.2)
Sodium: 141 mmol/L (ref 134–144)
Total Protein: 6.8 g/dL (ref 6.0–8.5)

## 2020-11-24 LAB — PTH, INTACT AND CALCIUM
Calcium: 9.1 mg/dL (ref 8.7–10.3)
PTH: 31 pg/mL (ref 15–65)

## 2020-11-24 LAB — PHOSPHORUS: Phosphorus: 4.1 mg/dL (ref 3.0–4.3)

## 2020-11-24 LAB — TSH: TSH: 2.69 u[IU]/mL (ref 0.450–4.500)

## 2020-11-24 LAB — T4, FREE: Free T4: 1.17 ng/dL (ref 0.82–1.77)

## 2020-11-24 LAB — T3: T3, Total: 106 ng/dL (ref 71–180)

## 2020-11-24 NOTE — Assessment & Plan Note (Addendum)
Stephanie Davis is presenting for evaluation of right hand tremor for three months duration. She endorses intermittent episodes of right fourth and fifth digit tremors that sometimes generalize to the entire right hand. She notes each episode lasts a few minutes until resolution without any inciting events. She denies any associated pain or paresthesias. She denies any prior trauma to neck or elbow. Musculokeletal and neuro exam nl. No tremor noted with voluntary movement. She does have a history of hyperparathyroidism; however, calcium and phos and PTH wnl. Although patient has been experiencing weight loss,thyroid panel unremarkable. No significant electrolyte abnormalities noted, although did have mildly elevated AST/ALT levels. She is not on any medications that could induce be causing this.  Differential at this time would include an ulnar nerve pathology vs psychologic tremor vs Parkinson tremor. However, no cogwheel rigidity, bradykinesia or postural instability noted at this time.   Plan: If persistent, would recommend further evaluation for ulnar nerve pathology

## 2020-11-24 NOTE — Assessment & Plan Note (Signed)
Patient with history of depression, not currently on any pharmacotherapy. Patient is accompanied by her daughter. Her husband recently passed away and she is currently coping with his loss. She does have good family support. She is experiencing insomnia with trouble staying asleep through the night. At this time, she does not wish to talk to a grief counselor. She would like to try melatonin first for her insomnia before pursuing other pharmacotherapy.   Plan: Offered condolences and support  Consider grief counseling in the future if patient desires

## 2020-11-29 ENCOUNTER — Telehealth: Payer: Self-pay | Admitting: Internal Medicine

## 2020-11-29 NOTE — Progress Notes (Signed)
Internal Medicine Clinic Attending ° °Case discussed with Dr. Aslam  At the time of the visit.  We reviewed the resident’s history and exam and pertinent patient test results.  I agree with the assessment, diagnosis, and plan of care documented in the resident’s note.  °

## 2020-11-29 NOTE — Telephone Encounter (Signed)
Pt calling to f/u with a Podiatry Referral for her Ingrown 2nd toenail of her right Foot she stated that was examined on 11/23/2020.  Please advise if a referral was to be placed.

## 2020-12-13 ENCOUNTER — Telehealth: Payer: Self-pay

## 2020-12-13 DIAGNOSIS — N183 Chronic kidney disease, stage 3 unspecified: Secondary | ICD-10-CM

## 2020-12-13 DIAGNOSIS — Z794 Long term (current) use of insulin: Secondary | ICD-10-CM

## 2020-12-13 DIAGNOSIS — E1122 Type 2 diabetes mellitus with diabetic chronic kidney disease: Secondary | ICD-10-CM

## 2020-12-13 NOTE — Telephone Encounter (Signed)
Requesting lab results, please call pt back.  

## 2020-12-14 NOTE — Telephone Encounter (Signed)
Spoke with Stephanie Davis regarding her lab results. Discussed normal values for her thyroid function tests. She mentions also requesting referral for podiatry as she has diabetes. Referral placed. All other questions and concerns addressed.

## 2020-12-21 ENCOUNTER — Encounter: Payer: Self-pay | Admitting: Podiatry

## 2020-12-21 ENCOUNTER — Ambulatory Visit: Payer: HMO | Admitting: Podiatry

## 2020-12-21 ENCOUNTER — Other Ambulatory Visit: Payer: Self-pay

## 2020-12-21 ENCOUNTER — Ambulatory Visit (INDEPENDENT_AMBULATORY_CARE_PROVIDER_SITE_OTHER): Payer: HMO

## 2020-12-21 DIAGNOSIS — M2041 Other hammer toe(s) (acquired), right foot: Secondary | ICD-10-CM | POA: Diagnosis not present

## 2020-12-21 DIAGNOSIS — L84 Corns and callosities: Secondary | ICD-10-CM | POA: Diagnosis not present

## 2020-12-21 NOTE — Progress Notes (Signed)
Subjective:   Patient ID: Stephanie Davis, female   DOB: 70 y.o.   MRN: [id number]   HPI Patient presents stating that she has had a lot of pain fourth digit right foot and states it seems that she is walking on the toe.  States it is gotten worse over the last 6 months and she cannot take care of it herself and patient does not smoke likes to be active   Review of Systems  All other systems reviewed and are negative.       Objective:  Physical Exam Vitals and nursing note reviewed.  Constitutional:      Appearance: She is well-developed and well-nourished.  Cardiovascular:     Pulses: Intact distal pulses.  Pulmonary:     Effort: Pulmonary effort is normal.  Musculoskeletal:        General: Normal range of motion.  Skin:    General: Skin is warm.  Neurological:     Mental Status: She is alert.     Neurovascular status intact muscle strength adequate range of motion adequate with patient found to have a plantar flexed fourth digit right that is exposed to the surface with keratotic lesion on the distal lateral aspect of the toe painful when pressed and the deformity appeared to be within the MPJ and also the distal interphalangeal joint.  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  Sterile debridement of lesion accomplished today and buttress pad dispensed elevate the toe with discussion of surgical intervention which may be necessary in this case.  We will see this patient back when symptomatic and encouraged to call  X-ray indicates there is significant distal deformity of the fourth digit right foot causing pressure on the distal portion of the toe

## 2021-01-17 ENCOUNTER — Other Ambulatory Visit: Payer: Self-pay | Admitting: Internal Medicine

## 2021-01-17 DIAGNOSIS — E1122 Type 2 diabetes mellitus with diabetic chronic kidney disease: Secondary | ICD-10-CM

## 2021-01-17 DIAGNOSIS — N1831 Chronic kidney disease, stage 3a: Secondary | ICD-10-CM

## 2021-01-17 NOTE — Telephone Encounter (Signed)
Pt has an appt with Dr Sande Brothers tomorrow.

## 2021-01-18 ENCOUNTER — Encounter: Payer: Self-pay | Admitting: Internal Medicine

## 2021-01-18 ENCOUNTER — Other Ambulatory Visit: Payer: Self-pay

## 2021-01-18 ENCOUNTER — Ambulatory Visit (INDEPENDENT_AMBULATORY_CARE_PROVIDER_SITE_OTHER): Payer: HMO | Admitting: Internal Medicine

## 2021-01-18 ENCOUNTER — Telehealth: Payer: Self-pay | Admitting: *Deleted

## 2021-01-18 DIAGNOSIS — Z0000 Encounter for general adult medical examination without abnormal findings: Secondary | ICD-10-CM

## 2021-01-18 DIAGNOSIS — Z1231 Encounter for screening mammogram for malignant neoplasm of breast: Secondary | ICD-10-CM

## 2021-01-18 DIAGNOSIS — E2839 Other primary ovarian failure: Secondary | ICD-10-CM

## 2021-01-18 NOTE — Progress Notes (Addendum)
This AWV is being conducted by TELEHEALTH - AUDIO only. The patient was located at home and I was located in [facility]. The patient's identity was confirmed using their DOB and current address. The patient or his/her legal guardian has consented to being evaluated through a telephone encounter and understands the associated risks (an examination cannot be done and the patient may need to come in for an appointment) / benefits (allows the patient to remain at home, decreasing exposure to coronavirus). I personally spent 45 minutes conducting the AWV.  Subjective:   Stephanie Davis is a 70 y.o. female who presents for a Medicare Annual Wellness Visit.  The following items have been reviewed and updated today in the appropriate area in the EMR.   Health Risk Assessment  Height, weight, BMI, and BP Visual acuity if needed Depression screen Fall risk / safety level Advance directive discussion Medical and family history were reviewed and updated Updating list of other providers & suppliers Medication reconciliation, including over the counter medicines Cognitive screen Written screening schedule Risk Factor list Personalized health advice, risky behaviors, and treatment advice  Social History   Social History Narrative   Current Social History 01/18/2021        Patient lives alone in a split level home which is 3 stories. There are 14 steps with handrails on both sides up to the entrance the patient uses.       Patient's method of transportation is personal car.      The highest level of education was high school diploma.      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      Identified important Relationships are "My daughters, my Renato Gails, church family/friends, good neighbors."       Pets : None       Interests / Fun: "Traveling- grandson in [place] / beach"      Current Stressors: "Death of my husband (on his birthday in January 2022)"        Religious / Personal Beliefs: "I believe in God" "[employer] all my life"       L. Jaxden Blyden, BSN, RN-BC                Objective:    Vitals: There were no vitals taken for this visit. Vitals are unable to obtained due to COVID-19 public health emergency  Activities of Daily Living In your present state of health, do you have any difficulty performing the following activities: 01/18/2021 11/23/2020  Hearing? N N  Vision? N N  Difficulty concentrating or making decisions? N N  Walking or climbing stairs? N Y  Comment - AT TIMES  Dressing or bathing? N N  Doing errands, shopping? N N  Preparing Food and eating ? - -  Using the Toilet? - -  In the past six months, have you accidently leaked urine? - -  Do you have problems with loss of bowel control? - -  Managing your Medications? - -  Managing your Finances? - -  Housekeeping or managing your Housekeeping? - -  Some recent data might be hidden    Goals Goals    .  Blood Pressure < 140/90    .  Exercise 3x per week (30 min per time) (pt-stated)      Walking/marching/seated and standing exercises with exercise band.    Marland Kitchen  HEMOGLOBIN A1C < 7    .  LDL CALC <  100       Fall Risk Fall Risk  01/18/2021 11/23/2020 10/04/2020 07/25/2020 06/02/2020  Falls in the past year? 1 1 0 0 0  Comment - FELL OFF SIDE OF TUB - - -  Number falls in past yr: 0 0 - 0 -  Injury with Fall? 0 0 - 0 -  Risk for fall due to : Impaired balance/gait - - - No Fall Risks  Risk for fall due to: Comment Was sitting on side of tub and slipped onto floor - - - -  Follow up Education provided;Falls prevention discussed - Falls prevention discussed - -   CDC Handout on Fall Prevention and Handout on Home Exercise Program, Access codes [email or web address] and [email or web address] mailed to patient with exercise band.    Depression Screen PHQ 2/9 Scores 01/18/2021 11/23/2020 10/04/2020 07/25/2020  PHQ - 2 Score 1 3 0 0  PHQ- 9 Score 6 11 - 0    Patient's husband passed away  on his birthday in January 2022. Patient is aware of [facility] for grief counseling if needed.  Cognitive Testing Six-Item Cognitive Screener   "I would like to ask you some questions that ask you to use your memory. I am going to name three objects. Please wait until I say all three words, then repeat them. Remember what they are  because I am going to ask you to name them again in a few minutes. Please repeat these words for me: APPLE--TABLE--PENNY." (Interviewer may repeat names 3 times if necessary but repetition not scored.)  Did patient correctly repeat all three words? Yes - may proceed with screen  What year is this? Correct What month is this? Correct What day of the week is this? Correct  What were the three objects I asked you to remember? . Apple Correct . Table Correct . Penny Correct  Score one point for each incorrect answer.  A score of 2 or more points warrants additional investigation.  Patient's score 0   Assessment and Plan:     Referral placed for dexa scan and mammogram She will contact Dr. Hanley Seamen to schedule eye exam She will contact Dr. Ewing Schlein to schedule colonoscopy. Patient did not want referral placed to GI. Requested she be the one to contact them. Patient's husband passed away on his birthday in January 2022. Patient is aware of [facility] for grief counseling if needed, but declined at this time. F/u scheduled with PCP in May 2022 Patient is willing to think about receiving Covid vaccines. Fact sheet mailed with [facility] scheduling number Discussed receiving Prevnar at next OV and TDaP at Pharmacy. Patient is willing to do this. She will begin seated and standing exercises with exercise band to increase strength and balance.   During the course of the visit the patient was educated and counseled about appropriate screening and preventive services as documented in the assessment and plan.  The printed AVS was given to the patient and included an updated screening  schedule, a list of risk factors, and personalized health advice.        Fredderick Severance, RN  01/18/2021

## 2021-01-18 NOTE — Patient Instructions (Addendum)
Things That May Be Affecting Your Health:  Alcohol  Hearing loss  Pain    Depression  Home Safety  Sexual Health  X Diabetes  Lack of physical activity  Stress   Difficulty with daily activities  Loneliness  Tiredness   Drug use  Medicines  Tobacco use   Falls  Motor Vehicle Safety X Weight   Food choices  Oral Health  Other    YOUR PERSONALIZED HEALTH PLAN : 1. Schedule your next subsequent Medicare Wellness visit in one year 2. Attend all of your regular appointments to address your medical issues 3. Complete the preventative screenings and services 4. [facility] 5. Please contact Dr. Hanley Seamen [phone] to schedule eye exam 6. Please contact Dr. Ewing Schlein [phone]) to schedule colonoscopy.  7. Please keep in mind, grief counseling is available at our clinic 8. We have scheduled a follow up with Dr. Huel Cote on Mar 09, 2021 at 9:15 9. Please consider receiving Covid vaccines. Call [phone] to schedule through [facility]  10. You can receive the Prevnar vaccine at your next visit and TDaP at your local Pharmacy. 11. Please begin seated and standing exercises with exercise band to increase strength and balance.  Annual Wellness Visit                       Medicare Covered Preventative Screenings and Services  Services & Screenings Men and Women Who How Often Need? Date of Last Service Action  Abdominal Aortic Aneurysm Adults with AAA risk factors Once     Alcohol Misuse and Counseling All Adults Screening once a year if no alcohol misuse. Counseling up to 4 face to face sessions.     Bone Density Measurement  Adults at risk for osteoporosis Once every 2 yrs X 11/21/2006   Lipid Panel Z13.6 All adults without CV disease Once every 5 yrs     Colorectal Cancer   Stool sample or  Colonoscopy All adults 50 and older   Once every year  Every 10 years X 06/16/2003   Depression All  Adults Once a year  Today   Diabetes Screening Blood glucose, post glucose load, or GTT Z13.1  All adults at risk  Pre-diabetics  Once per year  Twice per year     Diabetes  Self-Management Training All adults Diabetics 10 hrs first year; 2 hours subsequent years. Requires Copay     Glaucoma  Diabetics  Family history of glaucoma  African Americans 50 yrs +  Hispanic Americans 65 yrs + Annually - requires coppay X 02/14/2018   Hepatitis C Z72.89 or F19.20  High Risk for HCV  Born between 1945 and 1965  Annually  Once     HIV Z11.4 All adults based on risk  Annually btw ages 45 & 48 regardless of risk  Annually > 65 yrs if at increased risk     Lung Cancer Screening Asymptomatic adults aged 92-77 with 30 pack yr history and current smoker OR quit within the last 15 yrs Annually Must have counseling and shared decision making documentation before first screen     Medical Nutrition Therapy Adults with   Diabetes  Renal disease  Kidney transplant within past 3 yrs 3 hours first year; 2 hours subsequent years     Obesity and Counseling All adults Screening once a year Counseling if BMI 30 or higher  Today   Tobacco Use Counseling Adults who  use tobacco  Up to 8 visits in one year     Vaccines Z23  Hepatitis B  Influenza   Pneumonia  Adults   Once  Once every flu season  Two different vaccines separated by one year     Next Annual Wellness Visit People with Medicare Every year  Today     Services & Screenings Women Who How Often Need  Date of Last Service Action  Mammogram  Z12.31 Women over 40 One baseline ages 13-39. Annually ager 40 yrs+ X 06/20/2012   Pap tests All women Annually if high risk. Every 2 yrs for normal risk women     Screening for cervical cancer with   Pap (Z01.419 nl or Z01.411abnl) &  HPV Z11.51 Women aged 76 to 33 Once every 5 yrs     Screening pelvic and breast exams All women Annually if  high risk. Every 2 yrs for normal risk women     Sexually Transmitted Diseases  Chlamydia  Gonorrhea  Syphilis All at risk adults Annually for non pregnant females at increased risk         Services & Screenings Men Who How Ofter Need  Date of Last Service Action  Prostate Cancer - DRE & PSA Men over 50 Annually.  DRE might require a copay.     Sexually Transmitted Diseases  Syphilis All at risk adults Annually for men at increased risk        Fall Prevention in the Home, Adult Falls can cause injuries and can happen to people of all ages. There are many things you can do to make your home safe and to help prevent falls. Ask for help when making these changes. What actions can I take to prevent falls? General Instructions  Use good lighting in all rooms. Replace any light bulbs that burn out.  Turn on the lights in dark areas. Use night-lights.  Keep items that you use often in easy-to-reach places. Lower the shelves around your home if needed.  Set up your furniture so you have a clear path. Avoid moving your furniture around.  Do not have throw rugs or other things on the floor that can make you trip.  Avoid walking on wet floors.  If any of your floors are uneven, fix them.  Add color or contrast paint or tape to clearly mark and help you see: ? Grab bars or handrails. ? First and last steps of staircases. ? Where the edge of each step is.  If you use a stepladder: ? Make sure that it is fully opened. Do not climb a closed stepladder. ? Make sure the sides of the stepladder are locked in place. ? Ask someone to hold the stepladder while you use it.  Know where your pets are when moving through your home. What can I do in the bathroom?  Keep the floor dry. Clean up any water on the floor right away.  Remove soap buildup in the tub or shower.  Use nonskid mats or decals on the floor of the tub or shower.  Attach bath mats securely with  double-sided, nonslip rug tape.  If you need to sit down in the shower, use a plastic, nonslip stool.  Install grab bars by the toilet and in the tub and shower. Do not use towel bars as grab bars.      What can I do in the bedroom?  Make sure that you have a light by your bed that is easy  to reach.  Do not use any sheets or blankets for your bed that hang to the floor.  Have a firm chair with side arms that you can use for support when you get dressed. What can I do in the kitchen?  Clean up any spills right away.  If you need to reach something above you, use a step stool with a grab bar.  Keep electrical cords out of the way.  Do not use floor polish or wax that makes floors slippery. What can I do with my stairs?  Do not leave any items on the stairs.  Make sure that you have a light switch at the top and the bottom of the stairs.  Make sure that there are handrails on both sides of the stairs. Fix handrails that are broken or loose.  Install nonslip stair treads on all your stairs.  Avoid having throw rugs at the top or bottom of the stairs.  Choose a carpet that does not hide the edge of the steps on the stairs.  Check carpeting to make sure that it is firmly attached to the stairs. Fix carpet that is loose or worn. What can I do on the outside of my home?  Use bright outdoor lighting.  Fix the edges of walkways and driveways and fix any cracks.  Remove anything that might make you trip as you walk through a door, such as a raised step or threshold.  Trim any bushes or trees on paths to your home.  Check to see if handrails are loose or broken and that both sides of all steps have handrails.  Install guardrails along the edges of any raised decks and porches.  Clear paths of anything that can make you trip, such as tools or rocks.  Have leaves, snow, or ice cleared regularly.  Use sand or salt on paths during winter.  Clean up any spills in your garage  right away. This includes grease or oil spills. What other actions can I take?  Wear shoes that: ? Have a low heel. Do not wear high heels. ? Have rubber bottoms. ? Feel good on your feet and fit well. ? Are closed at the toe. Do not wear open-toe sandals.  Use tools that help you move around if needed. These include: ? Canes. ? Walkers. ? Scooters. ? Crutches.  Review your medicines with your doctor. Some medicines can make you feel dizzy. This can increase your chance of falling. Ask your doctor what else you can do to help prevent falls. Where to find more information  Centers for Disease Control and Prevention, [employer]AD[employer]: FootballExhibition.com.br  [employer] on Aging: https://[place].com/ Contact a doctor if:  You are afraid of falling at home.  You feel weak, drowsy, or dizzy at home.  You fall at home. Summary  There are many simple things that you can do to make your home safe and to help prevent falls.  Ways to make your home safe include removing things that can make you trip and installing grab bars in the bathroom.  Ask for help when making these changes in your home. This information is not intended to replace advice given to you by your health care provider. Make sure you discuss any questions you have with your health care provider. Document Revised: 05/18/2020 Document Reviewed: 05/18/2020 Elsevier Patient Education  2021 Elsevier Inc.   Health Maintenance, Female Adopting a healthy lifestyle and getting preventive care are important in promoting health and wellness. Ask your  health care provider about:  The right schedule for you to have regular tests and exams.  Things you can do on your own to prevent diseases and keep yourself healthy. What should I know about diet, weight, and exercise? Eat a healthy diet  Eat a diet that includes plenty of vegetables, fruits, low-fat dairy products, and lean protein.  Do not eat a lot of foods that are high in solid fats,  added sugars, or sodium.   Maintain a healthy weight Body mass index (BMI) is used to identify weight problems. It estimates body fat based on height and weight. Your health care provider can help determine your BMI and help you achieve or maintain a healthy weight. Get regular exercise Get regular exercise. This is one of the most important things you can do for your health. Most adults should:  Exercise for at least 150 minutes each week. The exercise should increase your heart rate and make you sweat (moderate-intensity exercise).  Do strengthening exercises at least twice a week. This is in addition to the moderate-intensity exercise.  Spend less time sitting. Even light physical activity can be beneficial. Watch cholesterol and blood lipids Have your blood tested for lipids and cholesterol at 68 years of age, then have this test every 5 years. Have your cholesterol levels checked more often if:  Your lipid or cholesterol levels are high.  You are older than 68 years of age.  You are at high risk for heart disease. What should I know about cancer screening? Depending on your health history and family history, you may need to have cancer screening at various ages. This may include screening for:  Breast cancer.  Cervical cancer.  Colorectal cancer.  Skin cancer.  Lung cancer. What should I know about heart disease, diabetes, and high blood pressure? Blood pressure and heart disease  High blood pressure causes heart disease and increases the risk of stroke. This is more likely to develop in people who have high blood pressure readings, are of African descent, or are overweight.  Have your blood pressure checked: ? Every 3-5 years if you are 1018-68 years of age. ? Every year if you are 68 years old or older. Diabetes Have regular diabetes screenings. This checks your fasting blood sugar level. Have the screening done:  Once every three years after age 940 if you are at a  normal weight and have a low risk for diabetes.  More often and at a younger age if you are overweight or have a high risk for diabetes. What should I know about preventing infection? Hepatitis B If you have a higher risk for hepatitis B, you should be screened for this virus. Talk with your health care provider to find out if you are at risk for hepatitis B infection. Hepatitis C Testing is recommended for:  Everyone born from [id number] through 1965.  Anyone with known risk factors for hepatitis C. Sexually transmitted infections (STIs)  Get screened for STIs, including gonorrhea and chlamydia, if: ? You are sexually active and are younger than 68 years of age. ? You are older than 68 years of age and your health care provider tells you that you are at risk for this type of infection. ? Your sexual activity has changed since you were last screened, and you are at increased risk for chlamydia or gonorrhea. Ask your health care provider if you are at risk.  Ask your health care provider about whether you are at high risk for  HIV. Your health care provider may recommend a prescription medicine to help prevent HIV infection. If you choose to take medicine to prevent HIV, you should first get tested for HIV. You should then be tested every 3 months for as long as you are taking the medicine. Pregnancy  If you are about to stop having your period (premenopausal) and you may become pregnant, seek counseling before you get pregnant.  Take 400 to 800 micrograms (mcg) of folic acid every day if you become pregnant.  Ask for birth control (contraception) if you want to prevent pregnancy. Osteoporosis and menopause Osteoporosis is a disease in which the bones lose minerals and strength with aging. This can result in bone fractures. If you are 7 years old or older, or if you are at risk for osteoporosis and fractures, ask your health care provider if you should:  Be screened for bone loss.  Take a  calcium or vitamin D supplement to lower your risk of fractures.  Be given hormone replacement therapy (HRT) to treat symptoms of menopause. Follow these instructions at home: Lifestyle  Do not use any products that contain nicotine or tobacco, such as cigarettes, e-cigarettes, and chewing tobacco. If you need help quitting, ask your health care provider.  Do not use street drugs.  Do not share needles.  Ask your health care provider for help if you need support or information about quitting drugs. Alcohol use  Do not drink alcohol if: ? Your health care provider tells you not to drink. ? You are pregnant, may be pregnant, or are planning to become pregnant.  If you drink alcohol: ? Limit how much you use to 0-1 drink a day. ? Limit intake if you are breastfeeding.  Be aware of how much alcohol is in your drink. In the [place] one drink equals one 12 oz bottle of beer (355 mL), one 5 oz glass of wine (148 mL), or one 1 oz glass of hard liquor (44 mL). General instructions  Schedule regular health, dental, and eye exams.  Stay current with your vaccines.  Tell your health care provider if: ? You often feel depressed. ? You have ever been abused or do not feel safe at home. Summary  Adopting a healthy lifestyle and getting preventive care are important in promoting health and wellness.  Follow your health care provider's instructions about healthy diet, exercising, and getting tested or screened for diseases.  Follow your health care provider's instructions on monitoring your cholesterol and blood pressure. This information is not intended to replace advice given to you by your health care provider. Make sure you discuss any questions you have with your health care provider. Document Revised: 10/08/2018 Document Reviewed: 10/08/2018 Elsevier Patient Education  2021 [employer].

## 2021-01-18 NOTE — Telephone Encounter (Signed)
During today's AWV, patient questioned need for continuing Crestor with total cholesterol of 63. States she has had some muscle weakness and weight loss and wonders if it is due to Crestor. Please advise.

## 2021-01-18 NOTE — Progress Notes (Signed)
I discussed the AWV findings with the RN who conducted the visit. I was present in the office suite and immediately available to provide assistance and direction throughout the time the service was provided.   

## 2021-01-19 NOTE — Telephone Encounter (Signed)
Always a good question. She is prescribed rosuvastatin to prevent an ischemic event like a heart attack or stroke, for which she is at increased risk due to diabetes and hypertension. Her cholesterol is well controlled on this medication, which is a good thing, looking back I see years when her cholesterol was not well controlled. We would recommend continuing a statin as it cuts her risk of an ischemic event in half. She can discuss her muscle aches in a visit, which may be unrelated to the crestor. If this muscle ache becomes a problem for her everyday life, we can reduce the dose of the crestor. Discontinuing the medicine all together is her choice, but the trade off is she will take on some extra risk for a future ischemic event.

## 2021-01-19 NOTE — Progress Notes (Signed)
Internal Medicine Clinic Attending  Case discussed with Dr.  Winters  at the time of the visit.  We reviewed the AWV findings.  I agree with the assessment, diagnosis, and plan of care documented in the AWV note.     

## 2021-01-20 NOTE — Telephone Encounter (Signed)
Patient notified of all info below. She will continue taking rosuvastatin. If she has any remaining questions, she will discuss with PCP at appt on 03/01/2021.

## 2021-01-30 NOTE — Telephone Encounter (Signed)
It looks like this as not addressed at previous visits. She has an appointment with her PCP in May. If this is more bothersome for her, can have her evaluated earlier to assess need for podiatry referral

## 2021-01-30 NOTE — Telephone Encounter (Signed)
Pt has already been to [facility] for 12/21/2020.

## 2021-02-10 ENCOUNTER — Other Ambulatory Visit: Payer: Self-pay | Admitting: Internal Medicine

## 2021-02-10 DIAGNOSIS — E1122 Type 2 diabetes mellitus with diabetic chronic kidney disease: Secondary | ICD-10-CM

## 2021-02-10 DIAGNOSIS — N183 Chronic kidney disease, stage 3 unspecified: Secondary | ICD-10-CM

## 2021-02-14 NOTE — Telephone Encounter (Signed)
Next appt scheduled 03/09/21 with PCP. 

## 2021-02-24 ENCOUNTER — Other Ambulatory Visit: Payer: Self-pay | Admitting: Internal Medicine

## 2021-02-24 DIAGNOSIS — N1832 Chronic kidney disease, stage 3b: Secondary | ICD-10-CM

## 2021-02-24 DIAGNOSIS — E1122 Type 2 diabetes mellitus with diabetic chronic kidney disease: Secondary | ICD-10-CM

## 2021-02-24 DIAGNOSIS — Z794 Long term (current) use of insulin: Secondary | ICD-10-CM

## 2021-02-24 DIAGNOSIS — I10 Essential (primary) hypertension: Secondary | ICD-10-CM

## 2021-03-09 ENCOUNTER — Encounter: Payer: Self-pay | Admitting: Internal Medicine

## 2021-03-09 ENCOUNTER — Ambulatory Visit (HOSPITAL_COMMUNITY)
Admission: RE | Admit: 2021-03-09 | Discharge: 2021-03-09 | Disposition: A | Discharge status: Home / Self Care | Payer: HMO | Source: Ambulatory Visit | Attending: Student in an Organized Health Care Education/Training Program | Admitting: Student in an Organized Health Care Education/Training Program

## 2021-03-09 ENCOUNTER — Ambulatory Visit (INDEPENDENT_AMBULATORY_CARE_PROVIDER_SITE_OTHER): Payer: HMO | Admitting: Internal Medicine

## 2021-03-09 ENCOUNTER — Other Ambulatory Visit: Payer: Self-pay

## 2021-03-09 VITALS — BP 153/72 | HR 70 | Temp 98.1°F | Ht 64.0 in | Wt 120.7 lb

## 2021-03-09 DIAGNOSIS — N183 Chronic kidney disease, stage 3 unspecified: Secondary | ICD-10-CM | POA: Diagnosis not present

## 2021-03-09 DIAGNOSIS — I10 Essential (primary) hypertension: Secondary | ICD-10-CM

## 2021-03-09 DIAGNOSIS — R7989 Other specified abnormal findings of blood chemistry: Secondary | ICD-10-CM | POA: Insufficient documentation

## 2021-03-09 DIAGNOSIS — M79645 Pain in left finger(s): Secondary | ICD-10-CM | POA: Insufficient documentation

## 2021-03-09 DIAGNOSIS — S6992XA Unspecified injury of left wrist, hand and finger(s), initial encounter: Secondary | ICD-10-CM | POA: Diagnosis not present

## 2021-03-09 DIAGNOSIS — E782 Mixed hyperlipidemia: Secondary | ICD-10-CM | POA: Diagnosis not present

## 2021-03-09 DIAGNOSIS — Z794 Long term (current) use of insulin: Secondary | ICD-10-CM | POA: Diagnosis not present

## 2021-03-09 DIAGNOSIS — E1122 Type 2 diabetes mellitus with diabetic chronic kidney disease: Secondary | ICD-10-CM

## 2021-03-09 LAB — POCT GLYCOSYLATED HEMOGLOBIN (HGB A1C): Hemoglobin A1C: 6.6 % — AB (ref 4.0–5.6)

## 2021-03-09 LAB — GLUCOSE, CAPILLARY: Glucose-Capillary: 159 mg/dL — ABNORMAL HIGH (ref 70–99)

## 2021-03-09 MED ORDER — CHLORTHALIDONE 50 MG PO TABS: 50.0000 mg | ORAL_TABLET | Freq: Every day | ORAL | 1 refills | Status: DC

## 2021-03-09 NOTE — Assessment & Plan Note (Addendum)
CMP was obtained at patient's last visit when she was being evaluated for tremors and LFTs were mildly elevated at that time.  We will repeat CMP today, as well as obtain a CBC to evaluate platelet function.  If continues to remain elevated, will consider right upper quadrant ultrasound.  If patient has continued LFT abnormalities, I suspect that this may metabolic associated fatty liver disease.  -CMP and CBC pending  ADDENDUM:  LFTs have returned to normal. Will continue to monitor periodically PRN.

## 2021-03-09 NOTE — Progress Notes (Signed)
   CC: T2DM, HTN  HPI:  [name] is a 70 y.o. with a PMHx as listed below who presents to the clinic for T2DM, HTN.   Please see the Encounters tab for problem-based Assessment & Plan regarding status of patient's acute and chronic conditions.  Past Medical History:  Diagnosis Date  . CKD (chronic kidney disease), stage III (HCC)   . Diabetes mellitus   . History of depression   . Hyperlipidemia   . Hyperparathyroidism    ? h/o hyperCa, no PTH level in chart, uptake study was negative; did not have intervention  . Hypertension    Review of Systems: Review of Systems  Constitutional: Positive for weight loss. Negative for chills and fever.  Respiratory: Negative for cough and shortness of breath.   Cardiovascular: Negative for chest pain and palpitations.  Gastrointestinal: Negative for abdominal pain, blood in stool, constipation, diarrhea, nausea and vomiting.  Genitourinary: Negative for dysuria, frequency, hematuria and urgency.  Musculoskeletal: Positive for joint pain (left thumb).  Neurological: Positive for dizziness (when hypoglycemic). Negative for focal weakness and headaches.    Physical Exam:  Vitals:   03/09/21 0857 03/09/21 0902 03/09/21 0949  BP: (!) 155/70 140/69 (!) 153/72  Pulse: 72  70  Temp: 98.1 F (36.7 C)    TempSrc: Oral    SpO2: 100%    Weight: 120 lb 11.2 oz (54.7 kg)    Height: 5\' 4"  (1.626 m)     Physical Exam Vitals and nursing note reviewed.  Constitutional:      General: She is not in acute distress.    Appearance: She is normal weight.  Cardiovascular:     Rate and Rhythm: Normal rate and regular rhythm.     Heart sounds: No murmur heard. No gallop.   Pulmonary:     Effort: No respiratory distress.     Breath sounds: Normal breath sounds. No wheezing, rhonchi or rales.  Abdominal:     General: Bowel sounds are normal. There is no distension.     Palpations: Abdomen is soft.     Tenderness: There is no abdominal  tenderness. There is no guarding.  Musculoskeletal:     Left hand: Swelling (Between IP and MCP joint of the left thumb), tenderness (Tenderness overlying the IP joint, as well as between the IP and MCP joint) and bony tenderness present. No lacerations. Decreased range of motion (Unable to flex thumb).     Right lower leg: No edema.     Left lower leg: No edema.  Skin:    General: Skin is warm and dry.  Neurological:     General: No focal deficit present.     Mental Status: She is alert and oriented to person, place, and time. Mental status is at baseline.  Psychiatric:        Mood and Affect: Mood normal.        Behavior: Behavior normal.        Thought Content: Thought content normal.        Judgment: Judgment normal.    Assessment & Plan:   See Encounters Tab for problem based charting.  Patient discussed with Dr. 

## 2021-03-09 NOTE — Assessment & Plan Note (Addendum)
Ms. Massaro states that she stopped taking her rosuvastatin a few weeks ago as she was concerned this was causing her weight loss.  Assessment/plan: Last lipid panel obtained in December 2020 shows a dramatically decreased LDL of 15 with total cholesterol of 63.  Given that patient has been off statin for 3 weeks now, we will repeat a lipid panel.  I suspect she may not need a statin therapy at this time for primary prevention.  - Lipid panel pending - Discontinue rosuvastatin  ADDENDUM:  LDL increased from 15 to 67 but still within goal of < 70. Patient would prefer to continue holding Crestor. Will recheck lipid panel in 1 year.

## 2021-03-09 NOTE — Assessment & Plan Note (Addendum)
Patient states that approximately 1 month ago, she closed the car door on her left thumb and has since been having swelling and inability to flex her thumb at al  This does cause difficulty in her daily life as she is having trouble gripping objects and using zippers.  Assessment/plan: On examination, patient has significant IP joint tenderness with swelling between the MCP and IP joints of the left thumb.  We will obtain a x-ray to evaluate for fracture.  - Left thumb x-ray pending  ADDENDUM:  Thumb xray normal. Discussed with patient. Patient will try increasing range of motion and call if no improvements.

## 2021-03-09 NOTE — Assessment & Plan Note (Signed)
BP: (!) 153/72  Patient states she is taking her medication daily that includes lisinopril and hydrochlorothiazide.  She denies any headaches, dizziness, chest pain, shortness of breath, leg swelling.  Assessment/plan: Blood pressure remains above goal at this time.  We will change HCTZ to chlorthalidone for increased potency specially in light of patient's CKD.  -Continue lisinopril 40 mg daily - Discontinue HCTZ - Start chlorthalidone 50 mg daily

## 2021-03-09 NOTE — Assessment & Plan Note (Addendum)
Lab Results  Component Value Date   HGBA1C 6.6 (A) 03/09/2021   Stephanie Davis states that she has been taking her Xultophy 16 units at bedtime without any difficulty.  She also continues to take Jardiance and metformin daily. She brought her glucometer today that shows morning CBGs mostly between 70-80, although there is some as high as 177.  She is within goal approximately 50% of the time and above goal approximately 50% of the time.  Lowest CBG is 65.  Ms. Gignac states she is symptomatic when her CBGs around 67-70, having dizziness and hunger pains.  She drinks orange juice at these times and these relieves her symptoms.  She is worried about weight loss that she has experienced over the past year despite increase p.o. intake.  Assessment/plan: A1c is dramatically improved at this time likely in the patient's setting of weight loss.  I suspect this is secondary to Xultophy.  Given patient's BMI is 20 at this time, I feel continued weight loss would become detrimental for her.  Even more so, her symptomatic hypoglycemia is concerning.  We will discontinue Xultophy and recheck her A1c in 3 months.  If it has continued to climb, can consider readdition of a GLP-1.  - Continue metformin and Jardiance - Discontinue Xultophy - Patient is due for microalbumin/creatinine ratio, however she had already urinated prior to appointment.  Will obtain at next visit - A1c in 3 months - BMP pending  ADDENDUM:   Renal function and electrolytes stable.

## 2021-03-09 NOTE — Patient Instructions (Addendum)
It was nice seeing you today! Thank you for choosing [facility] for your Primary Care.    Today we talked about:   1. Diabetes: Your A1c today is 6.6%. Please stop Xultophy today. We will re-check your A1c in 3 months and see if any additional medication needs to be added back  2. Blood pressure: I have changed HCTZ to Chlorthalidone. Continue to take Lisinopril and Diltiazem daily.   3. Thumb Pain: I have ordered an xray. I will call you with the results.   4. Go ahead and stop the Rosuvastatin.

## 2021-03-10 LAB — CBC
Hematocrit: 34.5 % (ref 34.0–46.6)
Hemoglobin: 10.8 g/dL — ABNORMAL LOW (ref 11.1–15.9)
MCH: 27.5 pg (ref 26.6–33.0)
MCHC: 31.3 g/dL — ABNORMAL LOW (ref 31.5–35.7)
MCV: 88 fL (ref 79–97)
Platelets: 213 10*3/uL (ref 150–450)
RBC: 3.93 x10E6/uL (ref 3.77–5.28)
RDW: 14 % (ref 11.7–15.4)
WBC: 4.7 10*3/uL (ref 3.4–10.8)

## 2021-03-10 LAB — CMP14 + ANION GAP
ALT: 14 IU/L (ref 0–32)
AST: 20 IU/L (ref 0–40)
Albumin/Globulin Ratio: 2.1 (ref 1.2–2.2)
Albumin: 4.2 g/dL (ref 3.8–4.8)
Alkaline Phosphatase: 90 IU/L (ref 44–121)
Anion Gap: 18 mmol/L (ref 10.0–18.0)
BUN/Creatinine Ratio: 21 (ref 12–28)
BUN: 24 mg/dL (ref 8–27)
Bilirubin Total: <0.2 mg/dL (ref 0.0–1.2)
CO2: 21 mmol/L (ref 20–29)
Calcium: 9.2 mg/dL (ref 8.7–10.3)
Chloride: 102 mmol/L (ref 96–106)
Creatinine, Ser: 1.14 mg/dL — ABNORMAL HIGH (ref 0.57–1.00)
Globulin, Total: 2 g/dL (ref 1.5–4.5)
Glucose: 165 mg/dL — ABNORMAL HIGH (ref 65–99)
Potassium: 4.7 mmol/L (ref 3.5–5.2)
Sodium: 141 mmol/L (ref 134–144)
Total Protein: 6.2 g/dL (ref 6.0–8.5)
eGFR: 53 mL/min/{1.73_m2} — ABNORMAL LOW (ref >59)

## 2021-03-10 LAB — LIPID PANEL
Chol/HDL Ratio: 2.4 ratio (ref 0.0–4.4)
Cholesterol, Total: 143 mg/dL (ref 100–199)
HDL: 60 mg/dL (ref >39)
LDL Chol Calc (NIH): 67 mg/dL (ref 0–99)
Triglycerides: 83 mg/dL (ref 0–149)
VLDL Cholesterol Cal: 16 mg/dL (ref 5–40)

## 2021-03-10 NOTE — Progress Notes (Signed)
Internal Medicine Clinic Attending  Case discussed with Dr. Basaraba  At the time of the visit.  We reviewed the resident's history and exam and pertinent patient test results.  I agree with the assessment, diagnosis, and plan of care documented in the resident's note.  

## 2021-04-18 ENCOUNTER — Ambulatory Visit: Payer: HMO

## 2021-05-25 ENCOUNTER — Encounter: Payer: HMO | Admitting: Internal Medicine

## 2021-06-07 ENCOUNTER — Other Ambulatory Visit: Payer: Self-pay | Admitting: Internal Medicine

## 2021-06-07 ENCOUNTER — Ambulatory Visit (INDEPENDENT_AMBULATORY_CARE_PROVIDER_SITE_OTHER): Payer: HMO | Admitting: Internal Medicine

## 2021-06-07 ENCOUNTER — Ambulatory Visit (HOSPITAL_COMMUNITY)
Admission: RE | Admit: 2021-06-07 | Discharge: 2021-06-07 | Disposition: A | Discharge status: Home / Self Care | Payer: HMO | Source: Ambulatory Visit | Attending: Internal Medicine | Admitting: Internal Medicine

## 2021-06-07 ENCOUNTER — Encounter: Payer: Self-pay | Admitting: Internal Medicine

## 2021-06-07 ENCOUNTER — Other Ambulatory Visit: Payer: Self-pay

## 2021-06-07 VITALS — BP 134/90 | HR 66 | Temp 98.3°F | Ht 64.0 in | Wt 102.5 lb

## 2021-06-07 DIAGNOSIS — N183 Chronic kidney disease, stage 3 unspecified: Secondary | ICD-10-CM | POA: Diagnosis not present

## 2021-06-07 DIAGNOSIS — R634 Abnormal weight loss: Secondary | ICD-10-CM

## 2021-06-07 DIAGNOSIS — Z794 Long term (current) use of insulin: Secondary | ICD-10-CM | POA: Diagnosis not present

## 2021-06-07 DIAGNOSIS — E1122 Type 2 diabetes mellitus with diabetic chronic kidney disease: Secondary | ICD-10-CM

## 2021-06-07 LAB — POCT GLYCOSYLATED HEMOGLOBIN (HGB A1C): Hemoglobin A1C: 11.1 % — AB (ref 4.0–5.6)

## 2021-06-07 LAB — GLUCOSE, CAPILLARY: Glucose-Capillary: 434 mg/dL — ABNORMAL HIGH (ref 70–99)

## 2021-06-07 MED ORDER — NOVOLOG MIX 70/30 FLEXPEN (70-30) 100 UNIT/ML ~~LOC~~ SUPN: 18.0000 [IU] | PEN_INJECTOR | Freq: Two times a day (BID) | SUBCUTANEOUS | 1 refills | Status: DC

## 2021-06-07 NOTE — Progress Notes (Signed)
   CC: Abnormal weight loss and type 2 diabetes mellitus  HPI:[name] is a 70 y.o. female who presents for evaluation of abnormal weight loss and type 2 diabetes mellitus. Please see individual problem based A/P for details.   Past Medical History:  Diagnosis Date   CKD (chronic kidney disease), stage III (HCC)    Diabetes mellitus    History of depression    Hyperlipidemia    Hyperparathyroidism    ? h/o hyperCa, no PTH level in chart, uptake study was negative; did not have intervention   Hypertension    Review of Systems:   Review of Systems  Constitutional:  Positive for weight loss. Negative for chills and fever.  HENT:  Negative for congestion.   Eyes:  Negative for blurred vision and double vision.  Respiratory:  Negative for cough and hemoptysis.   Cardiovascular:  Negative for chest pain and leg swelling.  Gastrointestinal:  Negative for abdominal pain, constipation and diarrhea.  Genitourinary:  Negative for hematuria.  Neurological:  Negative for dizziness and headaches.  Endo/Heme/Allergies:  Does not bruise/bleed easily.  Psychiatric/Behavioral:  Positive for depression. The patient is not nervous/anxious.     Physical Exam: Vitals:   06/07/21 0851 06/07/21 0859  BP: (!) 152/84 134/90  Pulse: 69 66  Temp: 98.3 F (36.8 C)   TempSrc: Oral   SpO2: 100%   Weight: 102 lb 8 oz (46.5 kg)   Height: 5\' 4"  (1.626 m)    General: Underweight, NAD, clear voice and expression of her thoughts during conversation  HEENT: Conjunctiva nl , antiicteric sclerae, moist mucous membranes, no exudate or erythema Cardiovascular: Normal rate, regular rhythm.  No murmurs, rubs, or gallops Pulmonary : Equal breath sounds, No wheezes, rales, or rhonchi Abdominal: soft, nontender,  bowel sounds present Ext: No edema in lower extremities, no tenderness to palpation of lower extremities.    Assessment & Plan:   See Encounters Tab for problem based charting.  Patient discussed with Dr. 

## 2021-06-07 NOTE — Patient Instructions (Addendum)
Thank you for trusting me with your care. To recap, today we discussed the following:   1. Type 2 diabetes mellitus  - POC Hbg A1C Lab Results  Component Value Date   HGBA1C 11.1 (A) 06/07/2021   - start back Insulin , 70/30  - insulin aspart protamine - aspart (NOVOLOG MIX 70/30 FLEXPEN) (70-30) 100 UNIT/ML FlexPen; Inject 18 Units into the skin 2 (two) times daily with a meal. Inject 18 units once daily  Dispense: 15 mL; Refill: 1     2. Weight loss, abnormal  - TSH - CMP14 + Anion Gap - CBC with Diff - DG Chest 2 View; Future - Urinalysis, Complete (81001) - Hepatitis C antibody - HIV antibody (with reflex) - Sed Rate (ESR) - CRP (C-Reactive Protein) - LDH - Fecal occult blood, imunochemical; Future

## 2021-06-07 NOTE — Telephone Encounter (Signed)
Refill Request-Pt seen this morning by Dr. Barbaraann Faster stating her medication has not been called in yet.  insulin aspart protamine - aspart (NOVOLOG MIX 70/30 FLEXPEN) (70-30) 100 UNIT/ML FlexPen 15 mL 1 06/07/2021            [facility]      [address] [place], [place], [place] [address]   [phone]

## 2021-06-07 NOTE — Telephone Encounter (Signed)
Novolog 70/30 rx was filled as "Print" - please re-send electronically  Thanks

## 2021-06-08 ENCOUNTER — Other Ambulatory Visit: Payer: Self-pay | Admitting: Internal Medicine

## 2021-06-08 ENCOUNTER — Ambulatory Visit: Payer: HMO

## 2021-06-08 DIAGNOSIS — E1122 Type 2 diabetes mellitus with diabetic chronic kidney disease: Secondary | ICD-10-CM

## 2021-06-08 LAB — CBC WITH DIFFERENTIAL/PLATELET
Basophils Absolute: 0 10*3/uL (ref 0.0–0.2)
Basos: 1 %
EOS (ABSOLUTE): 0.1 10*3/uL (ref 0.0–0.4)
Eos: 2 %
Hematocrit: 40.8 % (ref 34.0–46.6)
Hemoglobin: 13.2 g/dL (ref 11.1–15.9)
Immature Grans (Abs): 0 10*3/uL (ref 0.0–0.1)
Immature Granulocytes: 0 %
Lymphocytes Absolute: 1.8 10*3/uL (ref 0.7–3.1)
Lymphs: 36 %
MCH: 30 pg (ref 26.6–33.0)
MCHC: 32.4 g/dL (ref 31.5–35.7)
MCV: 93 fL (ref 79–97)
Monocytes Absolute: 0.5 10*3/uL (ref 0.1–0.9)
Monocytes: 10 %
Neutrophils Absolute: 2.6 10*3/uL (ref 1.4–7.0)
Neutrophils: 51 %
Platelets: 184 10*3/uL (ref 150–450)
RBC: 4.4 x10E6/uL (ref 3.77–5.28)
RDW: 12.6 % (ref 11.7–15.4)
WBC: 4.9 10*3/uL (ref 3.4–10.8)

## 2021-06-08 LAB — HIV ANTIBODY (ROUTINE TESTING W REFLEX): HIV Screen 4th Generation wRfx: NONREACTIVE

## 2021-06-08 LAB — TSH: TSH: 2.44 u[IU]/mL (ref 0.450–4.500)

## 2021-06-08 LAB — CMP14 + ANION GAP
ALT: 20 IU/L (ref 0–32)
AST: 23 IU/L (ref 0–40)
Albumin/Globulin Ratio: 2.1 (ref 1.2–2.2)
Albumin: 4.7 g/dL (ref 3.8–4.8)
Alkaline Phosphatase: 107 IU/L (ref 44–121)
Anion Gap: 20 mmol/L — ABNORMAL HIGH (ref 10.0–18.0)
BUN/Creatinine Ratio: 20 (ref 12–28)
BUN: 27 mg/dL (ref 8–27)
Bilirubin Total: 0.2 mg/dL (ref 0.0–1.2)
CO2: 23 mmol/L (ref 20–29)
Calcium: 10 mg/dL (ref 8.7–10.3)
Chloride: 93 mmol/L — ABNORMAL LOW (ref 96–106)
Creatinine, Ser: 1.34 mg/dL — ABNORMAL HIGH (ref 0.57–1.00)
Globulin, Total: 2.2 g/dL (ref 1.5–4.5)
Glucose: 424 mg/dL — ABNORMAL HIGH (ref 65–99)
Potassium: 4.1 mmol/L (ref 3.5–5.2)
Sodium: 136 mmol/L (ref 134–144)
Total Protein: 6.9 g/dL (ref 6.0–8.5)
eGFR: 43 mL/min/{1.73_m2} — ABNORMAL LOW (ref >59)

## 2021-06-08 LAB — HEPATITIS C ANTIBODY: Hep C Virus Ab: 0.4 s/co ratio (ref 0.0–0.9)

## 2021-06-08 LAB — SEDIMENTATION RATE: Sed Rate: 6 mm/hr (ref 0–40)

## 2021-06-08 LAB — LACTATE DEHYDROGENASE: LDH: 146 IU/L (ref 119–226)

## 2021-06-08 LAB — C-REACTIVE PROTEIN: CRP: 2 mg/L (ref 0–10)

## 2021-06-08 MED ORDER — NOVOLOG MIX 70/30 FLEXPEN (70-30) 100 UNIT/ML ~~LOC~~ SUPN: 18.0000 [IU] | PEN_INJECTOR | Freq: Two times a day (BID) | SUBCUTANEOUS | 1 refills | Status: DC

## 2021-06-08 NOTE — Telephone Encounter (Signed)
Call from pt again; she's currently at the pharmacy for Novolog. Thanks

## 2021-06-08 NOTE — Telephone Encounter (Signed)
Pt called / informed of refill; pt's very appreciative.

## 2021-06-09 ENCOUNTER — Encounter: Payer: Self-pay | Admitting: Internal Medicine

## 2021-06-09 DIAGNOSIS — R634 Abnormal weight loss: Secondary | ICD-10-CM | POA: Insufficient documentation

## 2021-06-09 NOTE — Assessment & Plan Note (Addendum)
Review of patient weight and BMI show patient has had a nl BMI since 2008. In 2019 you can see a down trend in weight starting at 147 to 120 on 03/09/2021. More concerning she has lost 18 lbs in the last 3 months, Her current weight is 102 and this is a BMI of 17.5. Review of systems is negative for other clues.  Normal pap smear in 2019. Last mammogram in 2013 was nl. Colonoscopy was in 2004. She has declined many attempts for continued  routine cancer screenings. She lost her husband in January and still grieving. Her appetite is decreased. She has insure at home.   Assessment/Plan: Abnormal weight loss. Correlates well with stopping Insulin, but will further evaluate as below. - TSH - CMP14 + Anion Gap - CBC with Diff - DG Chest 2 View; Future - Hepatitis C antibody - HIV antibody (with reflex) - Sed Rate (ESR) - CRP (C-Reactive Protein) - LDH - Fecal occult blood, imunochemical; Future     .

## 2021-06-09 NOTE — Assessment & Plan Note (Signed)
  Hgb A1c 6.6 % at the last visit. Current A1c 11.1%. Patient left her glucose meter in the car and therefore no values to review. On review of PCP's last note she was concerned for hypoglycemia and patient weight loss. Patient was on Xultophy and this was stopped 3 months ago. I will discuss weight loss in separate problem today, although it is most likely related to patient's diabetes. Elevated blood glucose to 434,patient denied polyuria, polydipsia, headache, fatigue, confusion, nausea, vomiting or diaphoresis.  Assessment/Plan: Uncontrolled Type 2 Diabetes Mellitus with hyperglycemia. Further workup ordered for weight loss, see abnormal weight loss under problem list. Patient likely will need some Insulin and possibly in catabolic state.  - Continue Metformin and Jardiance - insulin aspart protamine - aspart (NOVOLOG MIX 70/30 FLEXPEN) (70-30) 100 UNIT/ML FlexPen; Inject 18 Units into the skin 2 (two) times daily with a meal. Inject 18 units once daily  Dispense: 15 mL; Refill: 1 - Follow up in one month for weight check and review of blood glucose log.

## 2021-06-13 NOTE — Progress Notes (Signed)
Internal Medicine Clinic Attending  Case discussed with Dr. Steen  At the time of the visit.  We reviewed the resident's history and exam and pertinent patient test results.  I agree with the assessment, diagnosis, and plan of care documented in the resident's note.  

## 2021-07-11 ENCOUNTER — Other Ambulatory Visit: Payer: Self-pay | Admitting: Internal Medicine

## 2021-07-11 DIAGNOSIS — N1832 Chronic kidney disease, stage 3b: Secondary | ICD-10-CM

## 2021-07-11 DIAGNOSIS — E1122 Type 2 diabetes mellitus with diabetic chronic kidney disease: Secondary | ICD-10-CM

## 2021-07-24 ENCOUNTER — Other Ambulatory Visit: Payer: Self-pay | Admitting: Student

## 2021-07-24 DIAGNOSIS — I10 Essential (primary) hypertension: Secondary | ICD-10-CM

## 2021-08-22 ENCOUNTER — Other Ambulatory Visit: Payer: Self-pay | Admitting: Internal Medicine

## 2021-08-22 ENCOUNTER — Ambulatory Visit (INDEPENDENT_AMBULATORY_CARE_PROVIDER_SITE_OTHER): Payer: HMO | Admitting: Internal Medicine

## 2021-08-22 ENCOUNTER — Encounter: Payer: Self-pay | Admitting: Internal Medicine

## 2021-08-22 VITALS — BP 164/79 | HR 64 | Wt 114.8 lb

## 2021-08-22 DIAGNOSIS — E1121 Type 2 diabetes mellitus with diabetic nephropathy: Secondary | ICD-10-CM | POA: Diagnosis not present

## 2021-08-22 DIAGNOSIS — Z794 Long term (current) use of insulin: Secondary | ICD-10-CM | POA: Diagnosis not present

## 2021-08-22 DIAGNOSIS — N183 Chronic kidney disease, stage 3 unspecified: Secondary | ICD-10-CM | POA: Diagnosis not present

## 2021-08-22 DIAGNOSIS — I10 Essential (primary) hypertension: Secondary | ICD-10-CM

## 2021-08-22 DIAGNOSIS — R634 Abnormal weight loss: Secondary | ICD-10-CM | POA: Diagnosis not present

## 2021-08-22 DIAGNOSIS — Z23 Encounter for immunization: Secondary | ICD-10-CM | POA: Diagnosis not present

## 2021-08-22 DIAGNOSIS — Z1231 Encounter for screening mammogram for malignant neoplasm of breast: Secondary | ICD-10-CM

## 2021-08-22 DIAGNOSIS — Z8659 Personal history of other mental and behavioral disorders: Secondary | ICD-10-CM | POA: Diagnosis not present

## 2021-08-22 DIAGNOSIS — N1831 Chronic kidney disease, stage 3a: Secondary | ICD-10-CM | POA: Diagnosis not present

## 2021-08-22 DIAGNOSIS — N1832 Chronic kidney disease, stage 3b: Secondary | ICD-10-CM | POA: Diagnosis not present

## 2021-08-22 DIAGNOSIS — Z0000 Encounter for general adult medical examination without abnormal findings: Secondary | ICD-10-CM

## 2021-08-22 DIAGNOSIS — E782 Mixed hyperlipidemia: Secondary | ICD-10-CM | POA: Diagnosis not present

## 2021-08-22 DIAGNOSIS — E1122 Type 2 diabetes mellitus with diabetic chronic kidney disease: Secondary | ICD-10-CM

## 2021-08-22 LAB — POCT GLYCOSYLATED HEMOGLOBIN (HGB A1C): Hemoglobin A1C: 6.3 % — AB (ref 4.0–5.6)

## 2021-08-22 LAB — GLUCOSE, CAPILLARY: Glucose-Capillary: 194 mg/dL — ABNORMAL HIGH (ref 70–99)

## 2021-08-22 MED ORDER — CHLORTHALIDONE 50 MG PO TABS: 50.0000 mg | ORAL_TABLET | Freq: Every day | ORAL | 1 refills | Status: DC

## 2021-08-22 MED ORDER — ONETOUCH DELICA PLUS LANCET33G MISC: 3 refills | Status: AC

## 2021-08-22 MED ORDER — "INSULIN SYRINGE 30G X 5/16"" 0.5 ML MISC": 11 refills | Status: DC

## 2021-08-22 NOTE — Assessment & Plan Note (Addendum)
At last visit patient underwent workup for abnormal weight loss which was unremarkable. At that time patient had discontinued insulin and A1c jumped from 6.6 to 11.1. After reinitiating insulin patient's A1c improved to 6.3 today and patient gained 12 lbs, back up to 114. Weight loss likely secondary to insulin d/c, no further workup required.  Patient was prescribed jardiance but said that she does not recognize/is not taking it so removed it from her current med list. - continue current regimen 8 units novolog 70/30 mix 2x daily  - continue metformin -ophthalmology referral placed

## 2021-08-22 NOTE — Assessment & Plan Note (Signed)
LDL 67 in May, not currently on statin therapy

## 2021-08-22 NOTE — Assessment & Plan Note (Signed)
Flu shot today - mammogram referral placed

## 2021-08-22 NOTE — Assessment & Plan Note (Signed)
Discussed patient's husband's passing in January. There would have been married for 50 years this year. She is still grieving his loss but is coping well and endorses good family support.

## 2021-08-22 NOTE — Assessment & Plan Note (Signed)
Blood pressure remains uncontrolled today, 164/79 on recheck. Patient has been compliant with her medications which include chlorthalidone 50, lisinopril 40, and diltiazem 180. Today she is endorsing anxiety here in clinic. Before initiating further medical therapy would like to check home BP readings. - keep 2 week BP log - follow up levels in 2 weeks

## 2021-08-22 NOTE — Patient Instructions (Addendum)
Stephanie Davis  It was a pleasure seeing you in the clinic today.   We talked about your diabetes and your blood pressure.  Blood pressure Check your blood pressure at home for 2 weeks, check it a few hours after you have taken your medications and are relaxed. Write these numbers down and bring them to your next appointment.  2. Diabetes Continue taking your insulin as you have been. Your A1c is 6.3. Keep up the good work!  Please call our clinic at [phone] if you have any questions or concerns. The best time to call is Monday-Friday from 9am-4pm, but there is someone available 24/7 at the same number. If you need medication refills, please notify your pharmacy one week in advance and they will send [place] a request.   Thank you for letting us take part in your care. We look forward to seeing you next time!

## 2021-08-22 NOTE — Progress Notes (Signed)
   CC: Weight loss follow up  HPI:  [name] is a 70 y.o. PMH noted below, who presents to the [facility] for weight loss follow up. To see the management of his acute and chronic conditions, please refer to the A&P note under the encounters tab.   Past Medical History:  Diagnosis Date   CKD (chronic kidney disease), stage III (HCC)    Diabetes mellitus    History of depression    Hyperlipidemia    Hyperparathyroidism    ? h/o hyperCa, no PTH level in chart, uptake study was negative; did not have intervention   Hypertension    Review of Systems:  negative for weight loss, fatigue, dizziness, nausea, or vomitin  Physical Exam: Gen: Thin elderly female in NAD HEENT: normocephalic atraumatic, MMM, neck supple CV: RRR, no m/r/g   Resp: CTAB, normal WOB  GI: soft, flat, non-tender MSK: moves all extremities without difficulty, able to ambulate without assistance Skin:warm and dry Neuro:alert answering questions appropriately Psych: normal affect   Assessment & Plan:   See Encounters Tab for problem based charting.  Patient seen with Dr. Mayford Knife

## 2021-08-25 ENCOUNTER — Other Ambulatory Visit: Payer: Self-pay | Admitting: Student

## 2021-08-25 ENCOUNTER — Other Ambulatory Visit: Payer: Self-pay | Admitting: Internal Medicine

## 2021-08-25 DIAGNOSIS — Z794 Long term (current) use of insulin: Secondary | ICD-10-CM

## 2021-08-25 DIAGNOSIS — N1831 Chronic kidney disease, stage 3a: Secondary | ICD-10-CM

## 2021-08-25 DIAGNOSIS — E1122 Type 2 diabetes mellitus with diabetic chronic kidney disease: Secondary | ICD-10-CM

## 2021-08-25 DIAGNOSIS — N1832 Chronic kidney disease, stage 3b: Secondary | ICD-10-CM

## 2021-09-17 NOTE — Progress Notes (Signed)
.  att

## 2021-09-17 NOTE — Progress Notes (Signed)
Internal Medicine Clinic Attending  I saw and evaluated the patient.  I personally confirmed the key portions of the history and exam documented by Dr. DeMaio   and I reviewed pertinent patient test results.  The assessment, diagnosis, and plan were formulated together and I agree with the documentation in the resident's note.  

## 2021-10-06 ENCOUNTER — Other Ambulatory Visit: Payer: Self-pay | Admitting: Internal Medicine

## 2021-10-06 DIAGNOSIS — E1122 Type 2 diabetes mellitus with diabetic chronic kidney disease: Secondary | ICD-10-CM

## 2021-10-19 ENCOUNTER — Other Ambulatory Visit: Payer: Self-pay | Admitting: Internal Medicine

## 2021-10-19 DIAGNOSIS — I10 Essential (primary) hypertension: Secondary | ICD-10-CM

## 2021-12-04 ENCOUNTER — Other Ambulatory Visit: Payer: Self-pay | Admitting: Internal Medicine

## 2021-12-04 DIAGNOSIS — E1122 Type 2 diabetes mellitus with diabetic chronic kidney disease: Secondary | ICD-10-CM

## 2021-12-04 DIAGNOSIS — Z794 Long term (current) use of insulin: Secondary | ICD-10-CM

## 2021-12-04 DIAGNOSIS — N1831 Chronic kidney disease, stage 3a: Secondary | ICD-10-CM

## 2021-12-12 ENCOUNTER — Other Ambulatory Visit: Payer: Self-pay | Admitting: Internal Medicine

## 2021-12-12 DIAGNOSIS — N183 Chronic kidney disease, stage 3 unspecified: Secondary | ICD-10-CM

## 2021-12-12 DIAGNOSIS — I10 Essential (primary) hypertension: Secondary | ICD-10-CM

## 2021-12-12 MED ORDER — DILTIAZEM HCL ER COATED BEADS 180 MG PO CP24: 180.0000 mg | ORAL_CAPSULE | Freq: Every day | ORAL | 0 refills | Status: DC

## 2021-12-12 NOTE — Telephone Encounter (Signed)
Hello! Please contact patient to schedule appointment.

## 2021-12-18 ENCOUNTER — Encounter: Payer: Self-pay | Admitting: *Deleted

## 2022-01-16 ENCOUNTER — Encounter: Payer: Self-pay | Admitting: Internal Medicine

## 2022-01-16 ENCOUNTER — Ambulatory Visit (INDEPENDENT_AMBULATORY_CARE_PROVIDER_SITE_OTHER): Payer: HMO | Admitting: Internal Medicine

## 2022-01-16 VITALS — BP 138/65 | HR 64 | Temp 98.2°F | Ht 64.0 in | Wt 114.4 lb

## 2022-01-16 DIAGNOSIS — I10 Essential (primary) hypertension: Secondary | ICD-10-CM

## 2022-01-16 DIAGNOSIS — H5711 Ocular pain, right eye: Secondary | ICD-10-CM | POA: Diagnosis not present

## 2022-01-16 DIAGNOSIS — Z0000 Encounter for general adult medical examination without abnormal findings: Secondary | ICD-10-CM

## 2022-01-16 DIAGNOSIS — E1122 Type 2 diabetes mellitus with diabetic chronic kidney disease: Secondary | ICD-10-CM

## 2022-01-16 DIAGNOSIS — H524 Presbyopia: Secondary | ICD-10-CM | POA: Diagnosis not present

## 2022-01-16 DIAGNOSIS — Z794 Long term (current) use of insulin: Secondary | ICD-10-CM | POA: Diagnosis not present

## 2022-01-16 DIAGNOSIS — E782 Mixed hyperlipidemia: Secondary | ICD-10-CM

## 2022-01-16 DIAGNOSIS — H2513 Age-related nuclear cataract, bilateral: Secondary | ICD-10-CM | POA: Diagnosis not present

## 2022-01-16 DIAGNOSIS — N183 Chronic kidney disease, stage 3 unspecified: Secondary | ICD-10-CM

## 2022-01-16 DIAGNOSIS — I129 Hypertensive chronic kidney disease with stage 1 through stage 4 chronic kidney disease, or unspecified chronic kidney disease: Secondary | ICD-10-CM

## 2022-01-16 DIAGNOSIS — Z1211 Encounter for screening for malignant neoplasm of colon: Secondary | ICD-10-CM

## 2022-01-16 DIAGNOSIS — R634 Abnormal weight loss: Secondary | ICD-10-CM

## 2022-01-16 DIAGNOSIS — E1136 Type 2 diabetes mellitus with diabetic cataract: Secondary | ICD-10-CM | POA: Diagnosis not present

## 2022-01-16 DIAGNOSIS — H52203 Unspecified astigmatism, bilateral: Secondary | ICD-10-CM | POA: Diagnosis not present

## 2022-01-16 LAB — POCT GLYCOSYLATED HEMOGLOBIN (HGB A1C): Hemoglobin A1C: 5.6 % (ref 4.0–5.6)

## 2022-01-16 LAB — GLUCOSE, CAPILLARY: Glucose-Capillary: 236 mg/dL — ABNORMAL HIGH (ref 70–99)

## 2022-01-16 MED ORDER — METFORMIN HCL 1000 MG PO TABS: 1000.0000 mg | ORAL_TABLET | Freq: Two times a day (BID) | ORAL | 3 refills | Status: DC

## 2022-01-16 MED ORDER — NOVOLOG MIX 70/30 FLEXPEN (70-30) 100 UNIT/ML ~~LOC~~ SUPN: PEN_INJECTOR | SUBCUTANEOUS | 1 refills | Status: DC

## 2022-01-16 MED ORDER — DILTIAZEM HCL ER COATED BEADS 180 MG PO CP24: 180.0000 mg | ORAL_CAPSULE | Freq: Every day | ORAL | 0 refills | Status: DC

## 2022-01-16 MED ORDER — "INSULIN SYRINGE 30G X 5/16"" 0.5 ML MISC": 11 refills | Status: DC

## 2022-01-16 MED ORDER — CHLORTHALIDONE 50 MG PO TABS: 50.0000 mg | ORAL_TABLET | Freq: Every day | ORAL | 1 refills | Status: DC

## 2022-01-16 MED ORDER — LISINOPRIL 40 MG PO TABS: 40.0000 mg | ORAL_TABLET | Freq: Every day | ORAL | 3 refills | Status: DC

## 2022-01-16 MED ORDER — ONETOUCH VERIO VI STRP: ORAL_STRIP | 11 refills | Status: DC

## 2022-01-16 NOTE — Assessment & Plan Note (Signed)
Weight stable today.  We will continue to monitor at future visits and encouraged age-appropriate cancer screening. ?

## 2022-01-16 NOTE — Patient Instructions (Addendum)
It was nice seeing you today! Thank you for choosing [facility] for your Primary Care.  ?  ?Today we talked about:  ? ?Diabetes:  ?We talked about adjusting your insulin today.  ?Continue to take 8 units the morning ?Decrease to 6 units in the evening. When you wake up shaky, please take your blood sugar at this time.  ?Continue Metformin twice daily ?We will check your A1c today and I will call you with the results. ?  ?High blood pressure:  ?Since you are having increased urinary frequency at night, try switching Chlorthalidone to evening dosing instead of morning. If this does not help with the urinary frequency, call and let me know!  ?Otherwise, continue your medications as you have been.  ? ?The number to schedule the mammogram: [phone] ? ?The GI office will call you to schedule the colonoscopy  ?

## 2022-01-16 NOTE — Assessment & Plan Note (Signed)
BP: 138/65  ? ?Ms. Haggard states that she is taking chlorthalidone, diltiazem and lisinopril daily in the morning.  She denies any difficulty with her medications at this time.  She denies any dizziness with standing.  She denies any chest pain or shortness of breath with exertion or at rest.  She does note that she is having urinary frequency at nighttime only and wonders if it is from chlorthalidone as she takes this in the morning. ? ?Assessment/plan: ?Blood pressure within goal at this time.  ? ?- Refill chlorthalidone 50 mg daily.  Recommended she try taking it later in the day to see if this helps with her urinary symptoms.  If not, I recommended she reach back out to the clinic for further work-up at that time. ?- Refilled diltiazem 180 mg daily ?- Refilled lisinopril 40 mg daily ?- BMP today ?

## 2022-01-16 NOTE — Progress Notes (Signed)
? ?  CC: HTN; T2DM ? ?HPI: ? ?[name] is a 71 y.o. with a PMHx as listed below who presents to the clinic for HTN; T2DM.  ? ?Please see the Encounters tab for problem-based Assessment & Plan regarding status of patient's acute and chronic conditions. ? ?Past Medical History:  ?Diagnosis Date  ? CKD (chronic kidney disease), stage III ([place])   ? Diabetes mellitus   ? History of depression   ? Hyperlipidemia   ? Hyperparathyroidism   ? ? h/o hyperCa, no PTH level in chart, uptake study was negative; did not have intervention  ? Hypertension   ? ?Review of Systems: Review of Systems  ?Constitutional:  Negative for chills, fever, malaise/fatigue and weight loss.  ?Respiratory:  Negative for cough and shortness of breath.   ?Cardiovascular:  Negative for chest pain and leg swelling.  ?Gastrointestinal:  Negative for abdominal pain, nausea and vomiting.  ?Genitourinary:  Positive for frequency (only at night).  ?Musculoskeletal:  Negative for joint pain and myalgias.  ?Neurological:  Negative for dizziness, focal weakness and headaches.  ? ?Physical Exam: ? ?Vitals:  ? 01/16/22 0923 01/16/22 1000  ?BP: (!) 146/71 138/65  ?Pulse: 64   ?Temp: 98.2 ?F (36.8 ?C)   ?TempSrc: Oral   ?SpO2: 100%   ?Weight: 114 lb 6.4 oz (51.9 kg)   ?Height: 5\' 4"  (1.626 m)   ? ?Physical Exam ?Vitals and nursing note reviewed.  ?Constitutional:   ?   General: She is not in acute distress. ?   Appearance: She is normal weight.  ?HENT:  ?   Head: Normocephalic and atraumatic.  ?Eyes:  ?   Conjunctiva/sclera: Conjunctivae normal.  ?   Pupils: Pupils are equal, round, and reactive to light.  ?Cardiovascular:  ?   Rate and Rhythm: Normal rate and regular rhythm.  ?   Heart sounds: No murmur heard. ?  No gallop.  ?Pulmonary:  ?   Effort: Pulmonary effort is normal. No respiratory distress.  ?   Breath sounds: Normal breath sounds. No wheezing or rales.  ?Abdominal:  ?   General: Bowel sounds are normal.  ?   Palpations: Abdomen is soft.   ?Musculoskeletal:  ?   Right lower leg: No edema.  ?   Left lower leg: No edema.  ?Skin: ?   General: Skin is warm and dry.  ?   Findings: No erythema or rash.  ?Neurological:  ?   General: No focal deficit present.  ?   Mental Status: She is alert and oriented to person, place, and time. Mental status is at baseline.  ?Psychiatric:     ?   Mood and Affect: Mood normal.     ?   Behavior: Behavior normal.     ?   Thought Content: Thought content normal.     ?   Judgment: Judgment normal.  ? ? ?Assessment & Plan:  ? ?See Encounters Tab for problem based charting. ? ?Patient discussed with Dr.  Saverio Danker ? ?

## 2022-01-16 NOTE — Assessment & Plan Note (Addendum)
Lab Results  ?Component Value Date  ? HGBA1C 5.6 01/16/2022  ? ?Stephanie Davis states that she is currently injecting NovoLog 70/30 8 units twice daily. She continues to also take metformin 1000 mg twice daily. She is checking her sugars multiple times a day.  She has been experiencing jitteriness and shakiness in the middle the night around midnight to 1 AM but does not check her sugar at this time.  She drinks some orange juice or eat a cookie and that helps with her symptoms. ? ?Her glucometer data demonstrates most of her blood sugar checks are between 7 - 8 AM or 4 - 8 PM.  Her a.m. sugars average between 140-150 with the highest at 275 and the lowest at 83.  In the evening, her blood sugar fluctuates more frequently between 99-251, average around 150-160.  ? ?Assessment/plan: ?A1c today further improved and within normal range at this point.  Given that she is having suspected hypoglycemic events in the middle of the night, will decrease her evening NovoLog to 6 units only.  I have asked that she start checking her sugars at this time to confirm they are secondary to hypoglycemic events.  We did discuss changing to a long-acting insulin such as Tyler Aas, however Stephanie Davis is hesitant at this time as she has gone through many medication changes over the last year. ? ?- NovoLog 70/30, 8 units in the a.m. and 6 units in the p.m. ?- Continue metformin 1000 mg twice daily ?- BMP pending today ?- Follow-up in 3 months ? ?ADDENDUM: ?BMP with elevated creatinine at 1.58. Baseline between 1.1 - 1.5 since 2013. Will repeat at next visit.  ?

## 2022-01-16 NOTE — Assessment & Plan Note (Addendum)
[name] has been hesitant regarding statin therapy in the past.  She self discontinued after concerned that it was leading to weight loss.  Her LDL remained well within normal range, however given her history of type 2 diabetes, statin therapy is indicated regardless.  We will recheck lipid panel today and continue discussions. ? ?- Lipid panel pending ? ?ADDENDUM: ?Lipid panel with LDL at 77. This is within goal for primary prevention, however given history of T2DM, statin therapy is still recommended. I discussed this with Stephanie Davis. After explaining the benefits, she would prefer to hold off at this time.  ?

## 2022-01-16 NOTE — Assessment & Plan Note (Signed)
-   Phone number to the breast center provided to schedule mammogram ?- Colonoscopy ordered ?- Patient will reschedule visit for Pap smear ?

## 2022-01-17 ENCOUNTER — Other Ambulatory Visit: Payer: Self-pay | Admitting: Internal Medicine

## 2022-01-17 DIAGNOSIS — E1122 Type 2 diabetes mellitus with diabetic chronic kidney disease: Secondary | ICD-10-CM

## 2022-01-17 LAB — LIPID PANEL
Chol/HDL Ratio: 2.1 ratio (ref 0.0–4.4)
Cholesterol, Total: 179 mg/dL (ref 100–199)
HDL: 85 mg/dL (ref >39)
LDL Chol Calc (NIH): 77 mg/dL (ref 0–99)
Triglycerides: 93 mg/dL (ref 0–149)
VLDL Cholesterol Cal: 17 mg/dL (ref 5–40)

## 2022-01-17 LAB — BMP8+ANION GAP
Anion Gap: 16 mmol/L (ref 10.0–18.0)
BUN/Creatinine Ratio: 22 (ref 12–28)
BUN: 34 mg/dL — ABNORMAL HIGH (ref 8–27)
CO2: 23 mmol/L (ref 20–29)
Calcium: 9.2 mg/dL (ref 8.7–10.3)
Chloride: 99 mmol/L (ref 96–106)
Creatinine, Ser: 1.58 mg/dL — ABNORMAL HIGH (ref 0.57–1.00)
Glucose: 244 mg/dL — ABNORMAL HIGH (ref 70–99)
Potassium: 3.7 mmol/L (ref 3.5–5.2)
Sodium: 138 mmol/L (ref 134–144)
eGFR: 35 mL/min/{1.73_m2} — ABNORMAL LOW (ref >59)

## 2022-01-17 NOTE — Progress Notes (Signed)
Internal Medicine Clinic Attending  Case discussed with Dr. Basaraba  At the time of the visit.  We reviewed the resident's history and exam and pertinent patient test results.  I agree with the assessment, diagnosis, and plan of care documented in the resident's note.  

## 2022-01-18 ENCOUNTER — Other Ambulatory Visit: Payer: Self-pay | Admitting: *Deleted

## 2022-01-18 DIAGNOSIS — N1831 Chronic kidney disease, stage 3a: Secondary | ICD-10-CM

## 2022-01-18 MED ORDER — BD PEN NEEDLE MINI U/F 31G X 5 MM MISC: 5 refills | Status: DC

## 2022-01-22 ENCOUNTER — Telehealth: Payer: Self-pay | Admitting: Internal Medicine

## 2022-01-22 NOTE — Telephone Encounter (Signed)
Attempted to contact regarding recent lab results. VM left.  ?

## 2022-01-24 ENCOUNTER — Telehealth: Payer: Self-pay

## 2022-01-24 NOTE — Telephone Encounter (Signed)
Patient called regarding lab results call [phone] ?

## 2022-01-25 ENCOUNTER — Telehealth: Payer: Self-pay

## 2022-01-25 NOTE — Telephone Encounter (Signed)
Patient contacted to schedule mammogram.  ? ?RE: Mobile Mammo event located at: ? ?[facility]  [facility]  ?      ?[address]    ?[place]  [address]    ? ?Date: April 7th  ? ? ?LVM for pt to call back as soon as possible.  ? ?

## 2022-04-10 ENCOUNTER — Other Ambulatory Visit: Payer: Self-pay

## 2022-04-10 ENCOUNTER — Encounter: Payer: Self-pay | Admitting: Student

## 2022-04-10 ENCOUNTER — Ambulatory Visit (INDEPENDENT_AMBULATORY_CARE_PROVIDER_SITE_OTHER): Payer: HMO | Admitting: Student

## 2022-04-10 VITALS — BP 147/69 | HR 55 | Temp 98.4°F | Ht 64.0 in | Wt 117.4 lb

## 2022-04-10 DIAGNOSIS — Z794 Long term (current) use of insulin: Secondary | ICD-10-CM

## 2022-04-10 DIAGNOSIS — N183 Chronic kidney disease, stage 3 unspecified: Secondary | ICD-10-CM

## 2022-04-10 DIAGNOSIS — E559 Vitamin D deficiency, unspecified: Secondary | ICD-10-CM | POA: Diagnosis not present

## 2022-04-10 DIAGNOSIS — I10 Essential (primary) hypertension: Secondary | ICD-10-CM | POA: Diagnosis not present

## 2022-04-10 DIAGNOSIS — E1122 Type 2 diabetes mellitus with diabetic chronic kidney disease: Secondary | ICD-10-CM | POA: Diagnosis not present

## 2022-04-10 DIAGNOSIS — W19XXXA Unspecified fall, initial encounter: Secondary | ICD-10-CM

## 2022-04-10 DIAGNOSIS — Z87891 Personal history of nicotine dependence: Secondary | ICD-10-CM

## 2022-04-10 DIAGNOSIS — I129 Hypertensive chronic kidney disease with stage 1 through stage 4 chronic kidney disease, or unspecified chronic kidney disease: Secondary | ICD-10-CM

## 2022-04-10 NOTE — Progress Notes (Unsigned)
   CC: Fall  HPI:  [name] is a 71 y.o. female with PMH as below who presents to clinic for evaluation after a fall. Please see problem based charting for evaluation, assessment and plan.  Past Medical History:  Diagnosis Date   CKD (chronic kidney disease), stage III ([place])    Diabetes mellitus    History of depression    Hyperlipidemia    Hyperparathyroidism    ? h/o hyperCa, no PTH level in chart, uptake study was negative; did not have intervention   Hypertension     Review of Systems:  Constitutional: Negative for fever or fatigue. Eyes: Negative for visual changes MSK: Positive for coccygeal pain and fall Abdomen: Negative for abdominal pain, constipation or diarrhea Neuro: Negative for headache, dizziness or weakness  Physical Exam: General: Pleasant, elderly woman accompanied by granddaughter.  No acute distress. Cardiac: RRR. No murmurs, rubs or gallops. No LE edema Respiratory: Lungs CTAB. No wheezing or crackles. Skin: Warm, dry and intact without rashes or lesions MSK: Normal ROM. No tenderness to palpation of C-spine, T-spine or L-spine. Mild tenderness to palpation of the coccyx bone.  Extremities: Atraumatic. Full ROM. Palpable radial and DP pulses. Neuro: A&O x 3. Moves all extremities.  Normal sensation to gross touch. Psych: Appropriate mood and affect.  Vitals:   04/10/22 0920 04/10/22 0929 04/10/22 1020  BP: (!) 153/73 (!) 155/75 (!) 147/69  Pulse: 66 (!) 56 (!) 55  Temp: 98.4 F (36.9 C)    TempSrc: Oral    SpO2: 100%    Weight: 117 lb 6.4 oz (53.3 kg)    Height: 5\' 4"  (1.626 m)       Assessment & Plan:   See Encounters Tab for problem based charting.  Patient discussed with Dr. Lorenz Coaster, MD, MPH

## 2022-04-10 NOTE — Patient Instructions (Signed)
Thank you, Ms.Phill Myron for allowing [place] to provide your care today. Today we discussed your recent fall. Continue taking tylenol as needed for pain.   Your blood pressure was high here so we want you to check your blood pressure at home, write them down and bring the readings in 2 weeks.     For your diabetes, continue your current regimen. We will check your A1c in 2 weeks and get you on a once a day insulin.   I have ordered the following labs for you:   Lab Orders         BMP8+Anion Gap         Vitamin D (25 hydroxy)      I will call if any are abnormal. All of your labs can be accessed through "My Chart".   My Chart Access: [email or web address]  Please follow-up in 2 weeks for diabetes and BP follow up  Please make sure to arrive 15 minutes prior to your next appointment. If you arrive late, you may be asked to reschedule.    We look forward to seeing you next time. Please call our clinic at [phone] if you have any questions or concerns. The best time to call is Monday-Friday from 9am-4pm, but there is someone available 24/7. If after hours or the weekend, call the main hospital number and ask for the Internal Medicine Resident On-Call. If you need medication refills, please notify your pharmacy one week in advance and they will send [place] a request.   Thank you for letting us take part in your care. Wishing you the best!  Steffanie Rainwater, MD 04/10/2022, 10:33 AM IM Resident, PGY-2 [place] 41:10

## 2022-04-11 ENCOUNTER — Encounter: Payer: Self-pay | Admitting: Student

## 2022-04-11 DIAGNOSIS — W19XXXA Unspecified fall, initial encounter: Secondary | ICD-10-CM | POA: Insufficient documentation

## 2022-04-11 LAB — BMP8+ANION GAP
Anion Gap: 17 mmol/L (ref 10.0–18.0)
BUN/Creatinine Ratio: 22 (ref 12–28)
BUN: 31 mg/dL — ABNORMAL HIGH (ref 8–27)
CO2: 23 mmol/L (ref 20–29)
Calcium: 9.9 mg/dL (ref 8.7–10.3)
Chloride: 101 mmol/L (ref 96–106)
Creatinine, Ser: 1.39 mg/dL — ABNORMAL HIGH (ref 0.57–1.00)
Glucose: 82 mg/dL (ref 70–99)
Potassium: 4.3 mmol/L (ref 3.5–5.2)
Sodium: 141 mmol/L (ref 134–144)
eGFR: 41 mL/min/{1.73_m2} — ABNORMAL LOW (ref >59)

## 2022-04-11 LAB — VITAMIN D 25 HYDROXY (VIT D DEFICIENCY, FRACTURES): Vit D, 25-Hydroxy: 29.7 ng/mL — ABNORMAL LOW (ref 30.0–100.0)

## 2022-04-11 NOTE — Assessment & Plan Note (Signed)
Patient found to have SBP in the 150s in clinic. Repeat shows slight improvement in SBP to the 140s.  Patient states blood pressure has been normal in the past.  Her daughter is a Engineer, civil (consulting) and plans to check her blood pressure at home.  Patient advised to document these readings and bring to her next office visit.  BMP showed improvement in kidney function with creatinine down to 1.39 from 1.58 two months ago.   Plan: -Continue chlorthalidone 50 mg daily -Continue lisinopril 40 mg daily -Continue diltiazem 180 mg daily -Repeat BMP and BP at next office visit, patient advised to bring BP log to office visit

## 2022-04-11 NOTE — Assessment & Plan Note (Addendum)
Currently well controlled.  A1c 5.6% on January 16, 2022.  Patient denies any polyuria, polydipsia. Patient's meter shows an average blood sugar in the 150s but significant range from the 90s to the 280s. Patient currently on NovoLog 70/30, 8 units in the morning and 6 units in the evenings.  Discussed plan to switch patient to long-acting insulin. Patient agreeable to doing this at her next office visit.  Plan: -Continue NovoLog 70/30, 8 units in a.m. and 6 units in p.m. for now -Continue metformin 1000 mg twice daily -Follow-up in 1 to 2 weeks for repeat A1c. Switch patient to a long-acting insulin.

## 2022-04-11 NOTE — Assessment & Plan Note (Addendum)
Patient presents to clinic for evaluation after a fall. Patient states last week, she was walking down her stairs with her socks on when she slipped. States she fell down 2 stairs and landed on her tailbone. According to her daughter, patient had a mild bruise on her tailbone but no other abnormalities. Patient able to ambulate fine with no significant pain in her back. Only endorses mild discomfort at her tailbone when she sits down.  She denies any weakness or numbness. On exam, patient only has mild tenderness to palpation of the coccygeal bone.  Normal range of motion. On chart review patient had a DEXA scan in 2008 that showed osteopenia in the left forearm but normal results in the spine and femur.  Plan: -Follow-up vitamin D levels -Consider repeat DEXA scan if patient has another mechanical fall  Addendum: Vitamin D level was slightly low at 29.7.  Due to patient's age and recent fall, I will start patient on daily vitamin D supplementation. -Start vitamin D3 1000 units daily

## 2022-04-13 DIAGNOSIS — E559 Vitamin D deficiency, unspecified: Secondary | ICD-10-CM | POA: Insufficient documentation

## 2022-04-13 MED ORDER — VITAMIN D 25 MCG (1000 UNIT) PO TABS: 1000.0000 [IU] | ORAL_TABLET | Freq: Every day | ORAL | 2 refills | Status: AC

## 2022-04-13 NOTE — Assessment & Plan Note (Signed)
Patient presented to clinic after recent mechanical fall.  Last DEXA scan 15 years ago relatively normal.  Vitamin D level slightly low at 29.7. -Start vitamin D3 1000 units daily

## 2022-04-13 NOTE — Progress Notes (Signed)
Internal Medicine Clinic Attending  Case discussed with the resident at the time of the visit.  We reviewed the resident's history and exam and pertinent patient test results.  I agree with the assessment, diagnosis, and plan of care documented in the resident's note.  

## 2022-04-13 NOTE — Addendum Note (Signed)
Addended bySharrell Ku on: 04/13/2022 07:27 PM   Modules accepted: Orders

## 2022-06-16 ENCOUNTER — Other Ambulatory Visit: Payer: Self-pay | Admitting: Internal Medicine

## 2022-06-16 DIAGNOSIS — I10 Essential (primary) hypertension: Secondary | ICD-10-CM

## 2022-06-18 NOTE — Telephone Encounter (Signed)
Pt calling to f/u on the following medication   diltiazem (CARDIZEM CD) 180 MG 24 hr capsule  [facility] - [place] ([place]), [place] - [address]

## 2022-07-19 ENCOUNTER — Other Ambulatory Visit: Payer: Self-pay

## 2022-07-19 ENCOUNTER — Ambulatory Visit (INDEPENDENT_AMBULATORY_CARE_PROVIDER_SITE_OTHER): Payer: HMO

## 2022-07-19 VITALS — BP 173/80 | HR 70 | Temp 98.2°F | Ht 64.0 in | Wt 124.7 lb

## 2022-07-19 DIAGNOSIS — Z1231 Encounter for screening mammogram for malignant neoplasm of breast: Secondary | ICD-10-CM

## 2022-07-19 DIAGNOSIS — Z23 Encounter for immunization: Secondary | ICD-10-CM

## 2022-07-19 DIAGNOSIS — E1122 Type 2 diabetes mellitus with diabetic chronic kidney disease: Secondary | ICD-10-CM

## 2022-07-19 DIAGNOSIS — Z1211 Encounter for screening for malignant neoplasm of colon: Secondary | ICD-10-CM | POA: Diagnosis not present

## 2022-07-19 DIAGNOSIS — N183 Chronic kidney disease, stage 3 unspecified: Secondary | ICD-10-CM

## 2022-07-19 DIAGNOSIS — E785 Hyperlipidemia, unspecified: Secondary | ICD-10-CM

## 2022-07-19 DIAGNOSIS — I129 Hypertensive chronic kidney disease with stage 1 through stage 4 chronic kidney disease, or unspecified chronic kidney disease: Secondary | ICD-10-CM

## 2022-07-19 DIAGNOSIS — Z794 Long term (current) use of insulin: Secondary | ICD-10-CM | POA: Diagnosis not present

## 2022-07-19 DIAGNOSIS — I10 Essential (primary) hypertension: Secondary | ICD-10-CM

## 2022-07-19 DIAGNOSIS — Z0000 Encounter for general adult medical examination without abnormal findings: Secondary | ICD-10-CM

## 2022-07-19 LAB — GLUCOSE, CAPILLARY: Glucose-Capillary: 181 mg/dL — ABNORMAL HIGH (ref 70–99)

## 2022-07-19 LAB — POCT GLYCOSYLATED HEMOGLOBIN (HGB A1C): Hemoglobin A1C: 6 % — AB (ref 4.0–5.6)

## 2022-07-19 MED ORDER — ONETOUCH VERIO VI STRP: ORAL_STRIP | 11 refills | Status: AC

## 2022-07-19 MED ORDER — ATORVASTATIN CALCIUM 10 MG PO TABS: 10.0000 mg | ORAL_TABLET | Freq: Every day | ORAL | 11 refills | Status: DC

## 2022-07-19 NOTE — Assessment & Plan Note (Signed)
No hyperlipidemia on last lipid panel, but given she is 73 and has a diagnosis of diabetes, she warrants prophylactic treatment with a statin.  Started Lipitor 10 mg daily today.

## 2022-07-19 NOTE — Patient Instructions (Addendum)
[name], it was a pleasure seeing you today! You endorsed feeling well today. Below are some of the things we talked about this visit. We look forward to seeing you in the follow up appointment!  Today we discussed: Blood pressure: fill out the log once in the morning and once at night and ring it to your visit in 4 weeks. Keep taking your blood pressure meds in the meantime  Diabetes: we'll continue your current regimen for diabetes for now  Cholesterol: start taking lipitor 10mg  daily   Vitamin D: Keep taking your daily supplements.  I'll call you with lab results  I have ordered the following labs today:   Lab Orders         Glucose, capillary       Referrals ordered today:   Referral Orders  No referral(s) requested today     I have ordered the following medication/changed the following medications:   Stop the following medications: There are no discontinued medications.   Start the following medications: No orders of the defined types were placed in this encounter.    Follow-up:  4 weeks    Please make sure to arrive 15 minutes prior to your next appointment. If you arrive late, you may be asked to reschedule.   We look forward to seeing you next time. Please call our clinic at [phone] if you have any questions or concerns. The best time to call is Monday-Friday from 9am-4pm, but there is someone available 24/7. If after hours or the weekend, call the main hospital number and ask for the Internal Medicine Resident On-Call. If you need medication refills, please notify your pharmacy one week in advance and they will send [place] a request.  Thank you for letting us take part in your care. Wishing you the best!  Thank you, Linus Galas MD

## 2022-07-19 NOTE — Assessment & Plan Note (Addendum)
Flu shot today.  Sent referral for mammogram and provided FIT.  She will get remaining vaccinations including COVID booster, shingles vaccine, pneumonia vaccine, Tdap at her local pharmacy.

## 2022-07-19 NOTE — Assessment & Plan Note (Addendum)
Well controlled. Meter today shows avg 137, only 3 values >200 in past month. A couple lows in the afternoon before lunch especially if she naps after breakfast and has late lunch. Takes her 70/30 in the morning and nighttime (6u both times).  -had eye exam and foot exam this year - A1c and urine microalbumin/creatinin, BMP today. - Advised her to adopt a consistent meal schedule and not go too long without eating which likely precipitates her low blood sugars-continue current regimen of 70/30 6 units in the morning and 6 units with dinner.  Addendum: Cr stable but MA/Cr ratio elevated at >1000. Discussed results with Stephanie Davis and she mentions she has been consistently taking her lisinopril 40mg  daily and diltiazem 180 mg/day. In the settle of labile blood pressures as well, want to try other possibilities. D/cing lisinopril 40 and starting losartan 50mg  daily. Advised her to take her BP 2x/day and bring to the next appt. Would titrate losartan as needed and can also go up on diltiazem at that time as well. In addition to rechecking BMP and BP, would consider rechecking MA/Cr on new regimen as well to trend.   Please also discuss risks and benefits of jardiance including role in protecting kidney function at [facility]. She does have a remote (>10 years ago) history of admission for possible DKA, though her diabetes control was much worse at that point. Benefits might be worth the risks in this case.

## 2022-07-19 NOTE — Progress Notes (Addendum)
CC: Routine follow-up  HPI:  [name] is a 71 y.o.-year-old female with past medical history as below presenting for routine follow-up.  Please see encounters tab for problem-based charting.  Past Medical History:  Diagnosis Date   CKD (chronic kidney disease), stage III ([place])    Diabetes mellitus    History of depression    Hyperlipidemia    Hyperparathyroidism    ? h/o hyperCa, no PTH level in chart, uptake study was negative; did not have intervention   Hypertension    Review of Systems: As in HPI.  Please see encounters tab for problem based charting.  Physical Exam:  Vitals:   07/19/22 0901  BP: (!) 173/80  Pulse: 70  Temp: 98.2 F (36.8 C)  TempSrc: Oral  SpO2: 100%  Weight: 124 lb 11.2 oz (56.6 kg)  Height: 5\' 4"  (1.626 m)   General:Well-appearing, pleasant, In NAD Cardiac: RRR, no murmurs rubs or gallops. Respiratory: Normal work of breathing on room air, CTAB Abdominal: Soft, nontender, nondistended Extremities: No clear sensory deficits or weakness in bilateral upper/lower extremities  Assessment & Plan:   Essential hypertension SBP 170s and later 180 on recheck. No chest pain, headaches, vision changes. Taking chlorthalidone 50mg , diltiazem 180mg , lisinopril 40mg  daily. Mentions that she consistently has higher blood pressures at the doctor's office and that her blood pressure measurements at home are typically in the 110s to 120s.  Her daughter confirms this. - Sent home with blood pressure log, follow-up in 4 weeks and make appropriate changes at that time based on readings. - Repeat BMP today    Type 2 diabetes mellitus with stage 3 chronic kidney disease ([place]) Well controlled. Meter today shows avg 137, only 3 values >200 in past month. A couple lows in the afternoon before lunch especially if she naps after breakfast and has late lunch. Takes her 70/30 in the morning and nighttime (6u both times).  -had eye exam and foot exam this year -  A1c and urine microalbumin/creatinin, BMP today. - Advised her to adopt a consistent meal schedule and not go too long without eating which likely precipitates her low blood sugars-continue current regimen of 70/30 6 units in the morning and 6 units with dinner.  Addendum: Cr stable but MA/Cr ratio elevated at >1000. Discussed results with [name] and she mentions she has been consistently taking her lisinopril 40mg  daily and diltiazem 180 mg/day. In the settle of labile blood pressures as well, want to try other possibilities. D/cing lisinopril 40 and starting losartan 50mg  daily. Advised her to take her BP 2x/day and bring to the next appt. Would titrate losartan as needed and can also go up on diltiazem at that time as well. In addition to rechecking BMP and BP, would consider rechecking MA/Cr on new regimen as well to trend.   Please also discuss risks and benefits of jardiance including role in protecting kidney function at [facility]. She does have a remote (>10 years ago) history of admission for possible DKA, though her diabetes control was much worse at that point. Benefits might be worth the risks in this case.  Healthcare maintenance Flu shot today.  Sent referral for mammogram and provided FIT.  She will get remaining vaccinations including COVID booster, shingles vaccine, pneumonia vaccine, Tdap at her local pharmacy.  Hyperlipidemia No hyperlipidemia on last lipid panel, but given she is 41 and has a diagnosis of diabetes, she warrants prophylactic treatment with a statin.  Started Lipitor 10 mg daily  today.   Patient seen with Dr. Daryll Drown \

## 2022-07-19 NOTE — Assessment & Plan Note (Addendum)
SBP 170s and later 180 on recheck. No chest pain, headaches, vision changes. Taking chlorthalidone 50mg , diltiazem 180mg , lisinopril 40mg  daily. Mentions that she consistently has higher blood pressures at the doctor's office and that her blood pressure measurements at home are typically in the 110s to 120s.  Her daughter confirms this. - Sent home with blood pressure log, follow-up in 4 weeks and make appropriate changes at that time based on readings. - Repeat BMP today

## 2022-07-20 LAB — MICROALBUMIN / CREATININE URINE RATIO
Creatinine, Urine: 48.1 mg/dL
Microalb/Creat Ratio: 1187 mg/g creat — ABNORMAL HIGH (ref 0–29)
Microalbumin, Urine: 570.9 ug/mL

## 2022-07-20 LAB — BMP8+ANION GAP
Anion Gap: 20 mmol/L — ABNORMAL HIGH (ref 10.0–18.0)
BUN/Creatinine Ratio: 16 (ref 12–28)
BUN: 22 mg/dL (ref 8–27)
CO2: 23 mmol/L (ref 20–29)
Calcium: 9.8 mg/dL (ref 8.7–10.3)
Chloride: 100 mmol/L (ref 96–106)
Creatinine, Ser: 1.39 mg/dL — ABNORMAL HIGH (ref 0.57–1.00)
Glucose: 139 mg/dL — ABNORMAL HIGH (ref 70–99)
Potassium: 4 mmol/L (ref 3.5–5.2)
Sodium: 143 mmol/L (ref 134–144)
eGFR: 41 mL/min/{1.73_m2} — ABNORMAL LOW (ref >59)

## 2022-07-24 MED ORDER — LOSARTAN POTASSIUM 50 MG PO TABS: 50.0000 mg | ORAL_TABLET | Freq: Every day | ORAL | 1 refills | Status: DC

## 2022-07-24 NOTE — Addendum Note (Signed)
Addended byLinus Galas on: 07/24/2022 05:45 PM   Modules accepted: Orders

## 2022-07-26 ENCOUNTER — Encounter: Payer: Self-pay | Admitting: *Deleted

## 2022-07-26 NOTE — Progress Notes (Signed)
[facility] Quality Team Note  Name: Stephanie Davis Date of Birth: November 25, 1950 MRN: [id number] Date: 07/26/2022  [facility] Quality Team has reviewed this patient's chart, please see recommendations below:  [facility] Quality Other; (Pt has open gaps for Mammogram, Colonoscopy, Blood pressure, and AWV.  Called pt to arrange ov with the office.  Pt declined and stated that she would call the office once she arranges transportation with her daughter.  )

## 2022-07-26 NOTE — Progress Notes (Signed)
Internal Medicine Clinic Attending  Case discussed with Dr. Sridharan  at the time of the visit.  We reviewed the resident's history and exam and pertinent patient test results.  I agree with the assessment, diagnosis, and plan of care documented in the resident's note.  

## 2022-08-30 ENCOUNTER — Ambulatory Visit (INDEPENDENT_AMBULATORY_CARE_PROVIDER_SITE_OTHER): Payer: HMO | Admitting: Student

## 2022-08-30 ENCOUNTER — Ambulatory Visit (INDEPENDENT_AMBULATORY_CARE_PROVIDER_SITE_OTHER): Payer: HMO

## 2022-08-30 ENCOUNTER — Other Ambulatory Visit (HOSPITAL_COMMUNITY): Payer: Self-pay

## 2022-08-30 VITALS — BP 163/91 | HR 66 | Temp 98.7°F | Ht 64.0 in | Wt 124.2 lb

## 2022-08-30 DIAGNOSIS — E559 Vitamin D deficiency, unspecified: Secondary | ICD-10-CM

## 2022-08-30 DIAGNOSIS — I129 Hypertensive chronic kidney disease with stage 1 through stage 4 chronic kidney disease, or unspecified chronic kidney disease: Secondary | ICD-10-CM | POA: Diagnosis not present

## 2022-08-30 DIAGNOSIS — Z7984 Long term (current) use of oral hypoglycemic drugs: Secondary | ICD-10-CM

## 2022-08-30 DIAGNOSIS — N1832 Chronic kidney disease, stage 3b: Secondary | ICD-10-CM | POA: Diagnosis not present

## 2022-08-30 DIAGNOSIS — Z13820 Encounter for screening for osteoporosis: Secondary | ICD-10-CM | POA: Diagnosis not present

## 2022-08-30 DIAGNOSIS — E1122 Type 2 diabetes mellitus with diabetic chronic kidney disease: Secondary | ICD-10-CM

## 2022-08-30 DIAGNOSIS — Z0000 Encounter for general adult medical examination without abnormal findings: Secondary | ICD-10-CM | POA: Diagnosis not present

## 2022-08-30 DIAGNOSIS — Z1211 Encounter for screening for malignant neoplasm of colon: Secondary | ICD-10-CM

## 2022-08-30 DIAGNOSIS — I10 Essential (primary) hypertension: Secondary | ICD-10-CM

## 2022-08-30 DIAGNOSIS — Z794 Long term (current) use of insulin: Secondary | ICD-10-CM

## 2022-08-30 DIAGNOSIS — Z87891 Personal history of nicotine dependence: Secondary | ICD-10-CM

## 2022-08-30 DIAGNOSIS — Z780 Asymptomatic menopausal state: Secondary | ICD-10-CM

## 2022-08-30 MED ORDER — EMPAGLIFLOZIN 10 MG PO TABS: 10.0000 mg | ORAL_TABLET | Freq: Every day | ORAL | 0 refills | Status: DC

## 2022-08-30 MED ORDER — AMLODIPINE BESYLATE 5 MG PO TABS: 5.0000 mg | ORAL_TABLET | Freq: Every day | ORAL | 2 refills | Status: DC

## 2022-08-30 NOTE — Assessment & Plan Note (Addendum)
Patient with well controlled T2DM, currently taking metformin 1000mg  BID and novolog 70/30 6u BID. Recent A1c of 6.0% in 06/2022, indicating great control. She will not have much added benefit with tighter glycemic control once below 7%. We discussed discontinuing her novolog 70/30 today and instead will plan for SGLT-2i therapy or GLP-1a therapy for added CV and renal protection. I have reached out to [employer] to help decide which medication will be better covered by patient's insurance. Will contact patient to make a shared decision once I hear back from [place]. Of note, patient's BMI is close to underweight so would lean towards SGLT-2i therapy if possible to avoid weight-loss effects of GLP-1a.  Plan: -continue metformin 1000mg  BID -discontinued novolog 70/30 -reached to [place] about SGLT-2i versus GLP-1a therapy (preferably SGLT-1i) -next A1c in 1 month  ADDENDUM: GLP-1a therapy would require prior authorization under current insurance. However, she should have $0 copays for certain SGLT-2 inhibitors, including jardiance. Will start patient on jardiance 10mg  daily. Discussed with patient and she is agreeable. -start jardiance 10mg  daily

## 2022-08-30 NOTE — Progress Notes (Signed)
   CC: f/u of HTN  HPI:  [name] is a 71 y.o. female with history listed below presenting to the [facility] for f/u of HTN. Please see individualized problem based charting for full HPI.  Past Medical History:  Diagnosis Date   CKD (chronic kidney disease), stage III ([place])    Diabetes mellitus    History of depression    Hyperlipidemia    Hyperparathyroidism    ? h/o hyperCa, no PTH level in chart, uptake study was negative; did not have intervention   Hypertension     Review of Systems:  Negative aside from that listed in individualized problem based charting.  Physical Exam:  Vitals:   08/30/22 0859 08/30/22 0925  BP: (!) 160/90 (!) 163/91  Pulse: 65 66  Temp: 98.7 F (37.1 C)   TempSrc: Oral   SpO2: 100%   Weight: 124 lb 3.2 oz (56.3 kg)   Height: 5\' 4"  (1.626 m)    Physical Exam Constitutional:      Appearance: Normal appearance. She is not ill-appearing.  HENT:     Mouth/Throat:     Mouth: Mucous membranes are moist.     Pharynx: Oropharynx is clear.  Eyes:     Extraocular Movements: Extraocular movements intact.     Conjunctiva/sclera: Conjunctivae normal.     Pupils: Pupils are equal, round, and reactive to light.  Cardiovascular:     Rate and Rhythm: Normal rate and regular rhythm.     Pulses: Normal pulses.     Heart sounds: Normal heart sounds. No murmur heard.    No gallop.  Pulmonary:     Effort: Pulmonary effort is normal.     Breath sounds: Normal breath sounds. No wheezing, rhonchi or rales.  Abdominal:     General: Bowel sounds are normal. There is no distension.     Palpations: Abdomen is soft.     Tenderness: There is no abdominal tenderness.  Musculoskeletal:        General: Normal range of motion.  Skin:    General: Skin is warm and dry.  Neurological:     General: No focal deficit present.     Mental Status: She is alert and oriented to person, place, and time.  Psychiatric:        Mood and Affect: Mood normal.         Behavior: Behavior normal.      Assessment & Plan:   See Encounters Tab for problem based charting.  Patient discussed with Dr. Dareen Piano

## 2022-08-30 NOTE — Addendum Note (Signed)
Addended by: Virl Axe on: 08/30/2022 01:29 PM   Modules accepted: Orders

## 2022-08-30 NOTE — Patient Instructions (Signed)
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
## 2022-08-30 NOTE — Assessment & Plan Note (Addendum)
Patient living with HTN, currently taking diltiazem 180mg  daily, chlorthalidone 50mg  daily, losartan 50mg  daily. Her BP today is 160/90 and repeat is 163/91. She did bring a home BP log with 2 days of readings, which showed BP of 130s/80s on one day and BP of 160s/90s (repeat 150s/80s) on another day. She has some lability to her BP readings and there is likely an aspect of white coat HTN as well. Nonetheless, will need better BP control. Unclear as to why patient is on diltiazem instead of norvasc (no history of documented arrhythmias, etc.), so will make this adjustment today in hopes of better BP control. Can consider transitioning patient from chlorthalidone to hctz at subsequent visit in order to provide combination of losartan-hctz and possibly even losartan-hctz-norvasc in the future to decrease pill burden.   Plan: -continue chlorthalidone and losartan -changed diltiazem to amlodipine 5mg  daily -f/u in 1 month (provided BP log) -can increase amlodipine and/or losartan at next visit if needed

## 2022-08-30 NOTE — Assessment & Plan Note (Signed)
Placed referral to GI for screening colonoscopy. 

## 2022-08-30 NOTE — Patient Instructions (Addendum)
Ms. Renken,  It was a pleasure seeing you in the clinic today.   We made some changes to your medications today. We stopped your insulin (Novolog 70/30) so please stop taking this at home. I am waiting to hear back from our pharmacy team about which medicine to start that is better covered by your insurance. Once I get this information, I will give you a call and discuss this with you. We CHANGED your blood pressure medicine from diltiazem to another medicine called amlodipine (also called norvasc) to help better control your blood pressure. I have placed a referral to the stomach doctor for your colonoscopy. They will call you to schedule this appointment. I have ordered a DEXA scan to check your bone health. They will call you to schedule this appointment. Please come back for your next visit in 1 month.  Please call our clinic at [phone] if you have any questions or concerns. The best time to call is Monday-Friday from 9am-4pm, but there is someone available 24/7 at the same number. If you need medication refills, please notify your pharmacy one week in advance and they will send [place] a request.   Thank you for letting us take part in your care. We look forward to seeing you next time!

## 2022-08-30 NOTE — Assessment & Plan Note (Signed)
Patient due for DEXA screening. She does have a history of vitamin deficiency as well. Placed order for DEXA scan.  -f/u DEXA scan results

## 2022-08-31 NOTE — Addendum Note (Signed)
Addended by: Aldine Contes on: 08/31/2022 03:02 PM   Modules accepted: Level of Service

## 2022-08-31 NOTE — Progress Notes (Signed)
Internal Medicine Clinic Attending  Case discussed with Dr. Jinwala  At the time of the visit.  We reviewed the resident's history and exam and pertinent patient test results.  I agree with the assessment, diagnosis, and plan of care documented in the resident's note.  

## 2022-09-12 NOTE — Progress Notes (Signed)
I reviewed the AWV findings with the provider who conducted the visit. I was present in the office suite and immediately available to provide assistance and direction throughout the time the service was provided.  

## 2022-09-17 ENCOUNTER — Other Ambulatory Visit: Payer: Self-pay

## 2022-09-24 ENCOUNTER — Other Ambulatory Visit: Payer: Self-pay | Admitting: Student

## 2022-09-24 DIAGNOSIS — I10 Essential (primary) hypertension: Secondary | ICD-10-CM

## 2022-09-24 NOTE — Telephone Encounter (Signed)
chlorthalidone (HYGROTON) 50 MG tablet   [facility] - [place] ([place]), [place] - [address]

## 2022-09-25 MED ORDER — CHLORTHALIDONE 50 MG PO TABS: 50.0000 mg | ORAL_TABLET | Freq: Every day | ORAL | 1 refills | Status: DC

## 2022-10-23 ENCOUNTER — Other Ambulatory Visit: Payer: Self-pay

## 2022-11-08 ENCOUNTER — Ambulatory Visit (INDEPENDENT_AMBULATORY_CARE_PROVIDER_SITE_OTHER): Payer: HMO

## 2022-11-08 VITALS — BP 119/67 | HR 87 | Temp 98.4°F

## 2022-11-08 DIAGNOSIS — Z1231 Encounter for screening mammogram for malignant neoplasm of breast: Secondary | ICD-10-CM

## 2022-11-08 DIAGNOSIS — I129 Hypertensive chronic kidney disease with stage 1 through stage 4 chronic kidney disease, or unspecified chronic kidney disease: Secondary | ICD-10-CM

## 2022-11-08 DIAGNOSIS — E1122 Type 2 diabetes mellitus with diabetic chronic kidney disease: Secondary | ICD-10-CM

## 2022-11-08 DIAGNOSIS — N1832 Chronic kidney disease, stage 3b: Secondary | ICD-10-CM

## 2022-11-08 DIAGNOSIS — Z7984 Long term (current) use of oral hypoglycemic drugs: Secondary | ICD-10-CM | POA: Diagnosis not present

## 2022-11-08 DIAGNOSIS — Z8659 Personal history of other mental and behavioral disorders: Secondary | ICD-10-CM

## 2022-11-08 DIAGNOSIS — Z1211 Encounter for screening for malignant neoplasm of colon: Secondary | ICD-10-CM

## 2022-11-08 DIAGNOSIS — Z87891 Personal history of nicotine dependence: Secondary | ICD-10-CM | POA: Diagnosis not present

## 2022-11-08 DIAGNOSIS — R251 Tremor, unspecified: Secondary | ICD-10-CM | POA: Diagnosis not present

## 2022-11-08 DIAGNOSIS — I10 Essential (primary) hypertension: Secondary | ICD-10-CM

## 2022-11-08 DIAGNOSIS — Z0000 Encounter for general adult medical examination without abnormal findings: Secondary | ICD-10-CM

## 2022-11-08 DIAGNOSIS — R634 Abnormal weight loss: Secondary | ICD-10-CM

## 2022-11-08 LAB — POCT GLYCOSYLATED HEMOGLOBIN (HGB A1C): Hemoglobin A1C: 7.3 % — AB (ref 4.0–5.6)

## 2022-11-08 LAB — GLUCOSE, CAPILLARY: Glucose-Capillary: 162 mg/dL — ABNORMAL HIGH (ref 70–99)

## 2022-11-08 MED ORDER — EMPAGLIFLOZIN 25 MG PO TABS: 25.0000 mg | ORAL_TABLET | Freq: Every day | ORAL | 2 refills | Status: DC

## 2022-11-08 MED ORDER — MIRTAZAPINE 7.5 MG PO TABS: 7.5000 mg | ORAL_TABLET | Freq: Every day | ORAL | 0 refills | Status: DC

## 2022-11-08 NOTE — Assessment & Plan Note (Addendum)
Mentions that she has had weight loss and early satiety for the past few months.  She says she weighed about 120 pounds back in November and today she weighs 110 pounds.  She is overdue for colonoscopy and mammogram.  She has no cough, hemoptysis, dark stools, breast masses or discharge.  Additionally, she endorses reduced energy, reduced interest in performing her usual activities, sleep disturbances.  She was previously treated for depression after the death of her husband and one of her daughters.  PHQ-9 today was 6.  Believe some of her weight loss could be attributed to depression and will rule out possibilities for malignancy as well. - Starting mirtazapine 7.5 nightly - Sending referral for mammogram and sent FIT testing kit because she declined colonoscopy.  Can consider further imaging in the future if suspicion for occult malignancy increases.

## 2022-11-08 NOTE — Patient Instructions (Signed)
Stephanie Davis, it was a pleasure seeing you today! You endorsed feeling well today. Below are some of the things we talked about this visit. We look forward to seeing you in the follow up appointment!  Today we discussed: I am going up on your jardiance dose and adding mirtazipine 7.5mg  at night which might help with your appetite. Someone will call you to schedule a neurology appointment and mammogram. Please send back the stool sample.  I have ordered the following labs today:   Lab Orders         Fecal occult blood, imunochemical         Glucose, capillary         POC Hbg A1C       Referrals ordered today:    Referral Orders         Ambulatory referral to Neurology      I have ordered the following medication/changed the following medications:   Stop the following medications: Medications Discontinued During This Encounter  Medication Reason   JARDIANCE 10 MG TABS tablet Reorder     Start the following medications: Meds ordered this encounter  Medications   mirtazapine (REMERON) 7.5 MG tablet    Sig: Take 1 tablet (7.5 mg total) by mouth at bedtime.    Dispense:  30 tablet    Refill:  0   empagliflozin (JARDIANCE) 25 MG TABS tablet    Sig: Take 1 tablet (25 mg total) by mouth daily before breakfast.    Dispense:  30 tablet    Refill:  2     Follow-up: 2-58months weight loss   Please make sure to arrive 15 minutes prior to your next appointment. If you arrive late, you may be asked to reschedule.   We look forward to seeing you next time. Please call our clinic at [phone] if you have any questions or concerns. The best time to call is Monday-Friday from 9am-4pm, but there is someone available 24/7. If after hours or the weekend, call the main hospital number and ask for the Internal Medicine Resident On-Call. If you need medication refills, please notify your pharmacy one week in advance and they will send [place] a request.  Thank you for letting us take part in  your care. Wishing you the best!  Thank you, Linus Galas MD

## 2022-11-08 NOTE — Assessment & Plan Note (Addendum)
.  avg sugars ~160 std dev 20. Occasional highs but mostly ok.  A1c today 7.3.  Current regimen is metformin 1000 twice daily, Jardiance 10.  Increasing Jardiance to 25 mg maintenance dose to further assist with glucose control and kidney disease.  Do not think adding GLP-1 would be a good idea at this point given her unintentional weight loss.  Insulin risky as well given her fragility, but can consider adding on again in the future if needed.

## 2022-11-08 NOTE — Assessment & Plan Note (Addendum)
Endorses resting tremor, shuffling gait, no cognitive symptoms.  Her daughter who is also in the room confirms the symptoms and mentions that she seems to be moving slower and that her gait has changed recently.  Resting tremor on exam with questionable cogwheel rigidity.  Gait exam deferred as she was in wheelchair.  Will refer to neurology for formal Parkinson's evaluation and treatment.

## 2022-11-09 NOTE — Assessment & Plan Note (Signed)
See weight loss, P2 9 today was 6, starting mirtazapine low-dose to try to stimulate appetite treat mild depression symptoms.  Can add on SSRI as well in the future if needed.

## 2022-11-09 NOTE — Progress Notes (Signed)
   CC: Diabetes follow-up  HPI:  [name] is a 72 y.o.-year-old female with past medical history as below presenting for diabetes follow-up.  Please see encounters tab for problem-based charting.  Past Medical History:  Diagnosis Date   CKD (chronic kidney disease), stage III ([place])    Diabetes mellitus    History of depression    Hyperlipidemia    Hyperparathyroidism    ? h/o hyperCa, no PTH level in chart, uptake study was negative; did not have intervention   Hypertension    Review of Systems: As in HPI.  Please see encounters tab for problem based charting.  Physical Exam:  Vitals:   11/08/22 0912  BP: 119/67  Pulse: 87  Temp: 98.4 F (36.9 C)  TempSrc: Oral  SpO2: 100%   General:Well-appearing, pleasant, In NAD Cardiac: RRR, no murmurs rubs or gallops. Respiratory: Normal work of breathing on room air, CTAB Abdominal: Soft, nontender, nondistended Neuro: Resting tremor, questionable cogwheel rigidity, 4/5 strength all extremities.  Gait exam deferred  Assessment & Plan:   Type 2 diabetes mellitus with stage 3 chronic kidney disease ([place]) .avg sugars ~160 std dev 20. Occasional highs but mostly ok.  A1c today 7.3.  Current regimen is metformin 1000 twice daily, Jardiance 10.  Increasing Jardiance to 25 mg maintenance dose to further assist with glucose control and kidney disease.  Do not think adding GLP-1 would be a good idea at this point given her unintentional weight loss.  Insulin risky as well given her fragility, but can consider adding on again in the future if needed.  Abnormal weight loss Mentions that she has had weight loss and early satiety for the past few months.  She says she weighed about 120 pounds back in November and today she weighs 110 pounds.  She is overdue for colonoscopy and mammogram.  She has no cough, hemoptysis, dark stools, breast masses or discharge.  Additionally, she endorses reduced energy, reduced interest in performing her  usual activities, sleep disturbances.  She was previously treated for depression after the death of her husband and one of her daughters.  PHQ-9 today was 6.  Believe some of her weight loss could be attributed to depression and will rule out possibilities for malignancy as well. - Starting mirtazapine 7.5 nightly - Sending referral for mammogram and sent FIT testing kit because she declined colonoscopy.  Can consider further imaging in the future if suspicion for occult malignancy increases.  Tremor of right hand Endorses resting tremor, shuffling gait, no cognitive symptoms.  Her daughter who is also in the room confirms the symptoms and mentions that she seems to be moving slower and that her gait has changed recently.  Resting tremor on exam with questionable cogwheel rigidity.  Gait exam deferred as she was in wheelchair.  Will refer to neurology for formal Parkinson's evaluation and treatment.  Essential hypertension Blood pressure adequately controlled in clinic on current regimen of amlodipine 5, chlorthalidone 50, losartan 50 daily.  No symptoms reported.  History of depression See weight loss, P2 9 today was 6, starting mirtazapine low-dose to try to stimulate appetite treat mild depression symptoms.  Can add on SSRI as well in the future if needed.  Colon cancer screening Given FIT testing kit today.  Declined colonoscopy.  Healthcare maintenance Sent referral for mammogram, given FIT testing kit.   Patient discussed with Dr. Philipp Ovens

## 2022-11-09 NOTE — Assessment & Plan Note (Signed)
Given FIT testing kit today.  Declined colonoscopy.

## 2022-11-09 NOTE — Assessment & Plan Note (Signed)
Sent referral for mammogram, given FIT testing kit.

## 2022-11-09 NOTE — Assessment & Plan Note (Signed)
Blood pressure adequately controlled in clinic on current regimen of amlodipine 5, chlorthalidone 50, losartan 50 daily.  No symptoms reported.

## 2022-11-13 NOTE — Addendum Note (Signed)
Addended by: Jodean Lima on: 11/13/2022 09:22 AM   Modules accepted: Level of Service

## 2022-11-13 NOTE — Progress Notes (Signed)
Internal Medicine Clinic Attending  Case discussed with Dr. Sridharan  At the time of the visit.  We reviewed the resident's history and exam and pertinent patient test results.  I agree with the assessment, diagnosis, and plan of care documented in the resident's note.  

## 2022-11-27 ENCOUNTER — Other Ambulatory Visit: Payer: Self-pay | Admitting: Student

## 2023-01-24 ENCOUNTER — Encounter: Payer: HMO | Admitting: Internal Medicine

## 2023-01-31 ENCOUNTER — Encounter: Payer: HMO | Admitting: Internal Medicine

## 2023-01-31 ENCOUNTER — Other Ambulatory Visit: Payer: Self-pay

## 2023-01-31 DIAGNOSIS — E1122 Type 2 diabetes mellitus with diabetic chronic kidney disease: Secondary | ICD-10-CM

## 2023-02-13 ENCOUNTER — Other Ambulatory Visit: Payer: Self-pay

## 2023-02-13 ENCOUNTER — Ambulatory Visit (INDEPENDENT_AMBULATORY_CARE_PROVIDER_SITE_OTHER): Payer: PPO

## 2023-02-13 VITALS — BP 113/70 | HR 79 | Temp 98.1°F | Ht 64.0 in | Wt 106.6 lb

## 2023-02-13 DIAGNOSIS — R251 Tremor, unspecified: Secondary | ICD-10-CM | POA: Diagnosis not present

## 2023-02-13 DIAGNOSIS — E1122 Type 2 diabetes mellitus with diabetic chronic kidney disease: Secondary | ICD-10-CM | POA: Diagnosis not present

## 2023-02-13 DIAGNOSIS — Z794 Long term (current) use of insulin: Secondary | ICD-10-CM

## 2023-02-13 DIAGNOSIS — N1832 Chronic kidney disease, stage 3b: Secondary | ICD-10-CM

## 2023-02-13 DIAGNOSIS — E876 Hypokalemia: Secondary | ICD-10-CM | POA: Diagnosis not present

## 2023-02-13 DIAGNOSIS — R634 Abnormal weight loss: Secondary | ICD-10-CM

## 2023-02-13 DIAGNOSIS — Z8659 Personal history of other mental and behavioral disorders: Secondary | ICD-10-CM | POA: Diagnosis not present

## 2023-02-13 LAB — POCT GLYCOSYLATED HEMOGLOBIN (HGB A1C): Hemoglobin A1C: 7.1 % — AB (ref 4.0–5.6)

## 2023-02-13 LAB — GLUCOSE, CAPILLARY: Glucose-Capillary: 187 mg/dL — ABNORMAL HIGH (ref 70–99)

## 2023-02-13 MED ORDER — EMPAGLIFLOZIN 10 MG PO TABS: 10.0000 mg | ORAL_TABLET | Freq: Every day | ORAL | 2 refills | Status: DC

## 2023-02-13 MED ORDER — MIRTAZAPINE 7.5 MG PO TABS: 7.5000 mg | ORAL_TABLET | Freq: Every day | ORAL | 2 refills | Status: DC

## 2023-02-13 NOTE — Patient Instructions (Addendum)
Stephanie Davis, it was a pleasure seeing you today! You endorsed feeling well today. Below are some of the things we talked about this visit. We look forward to seeing you in the follow up appointment!  Today we discussed: Neurology: [facility] [place] Address: [address], [place], [place] [address] Phone: [phone]  Weight loss: I'm decreasing the jardiance to 10 mg daily. I refilled the remeron, please take this every night. You can take up to 3 ensures a day  I will call with lab results, please return the stool kit and try to get the mammogram done.  I have ordered the following labs today:  Lab Orders         Glucose, capillary         CMP14 + Anion Gap         CBC with Diff         Sed Rate (ESR)         CRP (C-Reactive Protein)         POC Hbg A1C       Referrals ordered today:   Referral Orders  No referral(s) requested today     I have ordered the following medication/changed the following medications:   Stop the following medications: Medications Discontinued During This Encounter  Medication Reason   mirtazapine (REMERON) 7.5 MG tablet Reorder   JARDIANCE 25 MG TABS tablet Reorder     Start the following medications: Meds ordered this encounter  Medications   empagliflozin (JARDIANCE) 10 MG TABS tablet    Sig: Take 1 tablet (10 mg total) by mouth daily before breakfast.    Dispense:  30 tablet    Refill:  2   mirtazapine (REMERON) 7.5 MG tablet    Sig: Take 1 tablet (7.5 mg total) by mouth at bedtime.    Dispense:  30 tablet    Refill:  2     Follow-up:  4-6 weeks weight loss    Please make sure to arrive 15 minutes prior to your next appointment. If you arrive late, you may be asked to reschedule.   We look forward to seeing you next time. Please call our clinic at [phone] if you have any questions or concerns. The best time to call is Monday-Friday from 9am-4pm, but  there is someone available 24/7. If after hours or the weekend, call the main hospital number and ask for the Internal Medicine Resident On-Call. If you need medication refills, please notify your pharmacy one week in advance and they will send [place] a request.  Thank you for letting us take part in your care. Wishing you the best!  Thank you, Lyndle Herrlich MD

## 2023-02-14 LAB — CBC WITH DIFFERENTIAL/PLATELET
EOS (ABSOLUTE): 0 10*3/uL (ref 0.0–0.4)
Immature Granulocytes: 0 %
MCH: 31.2 pg (ref 26.6–33.0)
MCV: 95 fL (ref 79–97)
Monocytes Absolute: 0.6 10*3/uL (ref 0.1–0.9)
Neutrophils: 64 %

## 2023-02-14 LAB — SEDIMENTATION RATE: Sed Rate: 10 mm/hr (ref 0–40)

## 2023-02-14 NOTE — Assessment & Plan Note (Signed)
Unfortunately, she has not gotten the mammogram, completed FIT testing, and is inconsistently taking her mirtazapine.  Weight today is down to 106 pounds.  She still does not have any symptoms including cough, hemoptysis, dark stools, breast masses or discharge.  She still has some depression symptoms and acknowledges these.  Family is tried supplementing her p.o. intake with ensures and other shakes, but she has not been able to take more than 2 of these a day.  She does identify Jardiance as a potential appetite suppressant.  There do not seem to be any functional limitations of p.o. intake such as dysphagia or lack of access to foods.  Her main concern is that she has early satiety and limited appetite. - I strongly encouraged her to complete the malignancy screenings we discussed at last visit.  At some point, it might also be worth it to obtain a scan of her chest abdomen pelvis if she does not complete these by the next visit. - I also strongly encouraged her to take her Remeron consistently as if her appetite loss is due to depression, this can significantly help. - Will pull back on Jardiance to 10 mg daily as well and see if this helps with her appetite. - Will repeat weight loss lab work for further guidance.

## 2023-02-14 NOTE — Assessment & Plan Note (Addendum)
A1c today 7.1 on Jardiance 25.  Of note, she mentions that she is losing more weight on the 25 mg dose of Jardiance as opposed to the 10 mg dose as that she was on before.  She feels like [place] is restricting her appetite.  Going to pull back to Jardiance 10 and see how she does.  Can consider other options for proteinuria such as maxing out on ARB if needed.

## 2023-02-14 NOTE — Assessment & Plan Note (Signed)
She did not follow-up with neurology.  On exam today, her resting tremor is slightly more noticeable and she does seem to have cogwheel rigidity.  Again, I would strongly recommend that she follow-up with neurology for formal diagnosis of Parkinson's if appropriate.  I am not sure if she is a good candidate for medical management at this time regardless with everything else going on including weight loss, though her Parkinson's might also contribute to her weight loss.

## 2023-02-14 NOTE — Assessment & Plan Note (Signed)
PHQ-9 increasing.  See separately about mirtazapine to try to stimulate appetite and treat symptoms.  Might also consider starting SSRI at next office visit if her symptoms persist.

## 2023-02-14 NOTE — Progress Notes (Addendum)
CC: routine follow up   HPI:  [name] is a 72 y.o.-year-old female with past medical history as below presenting for follow up of weight loss, diabetes, tremor.  Please see encounters tab for problem-based charting.  Past Medical History:  Diagnosis Date   CKD (chronic kidney disease), stage III    Diabetes mellitus    History of depression    Hyperlipidemia    Hyperparathyroidism    ? h/o hyperCa, no PTH level in chart, uptake study was negative; did not have intervention   Hypertension    Review of Systems: As in HPI.  Please see encounters tab for problem based charting.  Physical Exam:  Vitals:   02/13/23 1414  BP: 113/70  Pulse: 79  Temp: 98.1 F (36.7 C)  TempSrc: Oral  SpO2: 100%  Weight: 106 lb 9.6 oz (48.4 kg)  Height:  (1.626 m)   General: Appears malnourished, almost cachectic, pleasant, In NAD Cardiac: RRR, no murmurs rubs or gallops. Respiratory: Normal work of breathing on room air, CTAB Abdominal: Soft, nontender, nondistended Neuro: Resting tremor bilaterally.  Cogwheel rigidity more noticeable on right upper extremity  Assessment & Plan:   Type 2 diabetes mellitus with stage 3 chronic kidney disease ([facility]) A1c today 7.1 on Jardiance 25.  Of note, she mentions that she is losing more weight on the 25 mg dose of Jardiance as opposed to the 10 mg dose as that she was on before.  She feels like [place] is restricting her appetite.  Going to pull back to Jardiance 10 and see how she does.  Can consider other options for proteinuria such as maxing out on ARB if needed.  Abnormal weight loss Unfortunately, she has not gotten the mammogram, completed FIT testing, and is inconsistently taking her mirtazapine.  Weight today is down to 106 pounds.  She still does not have any symptoms including cough, hemoptysis, dark stools, breast masses or discharge.  She still has some depression symptoms and acknowledges these.  Family is tried supplementing  her p.o. intake with ensures and other shakes, but she has not been able to take more than 2 of these a day.  She does identify Jardiance as a potential appetite suppressant.  There do not seem to be any functional limitations of p.o. intake such as dysphagia or lack of access to foods.  Her main concern is that she has early satiety and limited appetite. - I strongly encouraged her to complete the malignancy screenings we discussed at last visit.  At some point, it might also be worth it to obtain a scan of her chest abdomen pelvis if she does not complete these by the next visit. - I also strongly encouraged her to take her Remeron consistently as if her appetite loss is due to depression, this can significantly help. - Will pull back on Jardiance to 10 mg daily as well and see if this helps with her appetite. - Will repeat weight loss lab work for further guidance.  History of depression PHQ-9 increasing.  See separately about mirtazapine to try to stimulate appetite and treat symptoms.  Might also consider starting SSRI at next office visit if her symptoms persist.  Tremor of right hand She did not follow-up with neurology.  On exam today, her resting tremor is slightly more noticeable and she does seem to have cogwheel rigidity.  Again, I would strongly recommend that she follow-up with neurology for formal diagnosis of Parkinson's if appropriate.  I am  not sure if she is a good candidate for medical management at this time regardless with everything else going on including weight loss, though her Parkinson's might also contribute to her weight loss.  Hypokalemia Addendum: K 2.9 at this visit likely due to poor PO intake. Will send daily supplement and recheck after 1 week   Patient discussed with Dr. Mikey Bussing

## 2023-02-15 DIAGNOSIS — E876 Hypokalemia: Secondary | ICD-10-CM | POA: Insufficient documentation

## 2023-02-15 LAB — CBC WITH DIFFERENTIAL/PLATELET
Basophils Absolute: 0 10*3/uL (ref 0.0–0.2)
Basos: 0 %
Eos: 1 %
Hematocrit: 36.2 % (ref 34.0–46.6)
Hemoglobin: 11.9 g/dL (ref 11.1–15.9)
Immature Grans (Abs): 0 10*3/uL (ref 0.0–0.1)
Lymphocytes Absolute: 1.4 10*3/uL (ref 0.7–3.1)
Lymphs: 24 %
MCHC: 32.9 g/dL (ref 31.5–35.7)
Monocytes: 11 %
Neutrophils Absolute: 3.8 10*3/uL (ref 1.4–7.0)
Platelets: 232 10*3/uL (ref 150–450)
RBC: 3.82 x10E6/uL (ref 3.77–5.28)
RDW: 11.8 % (ref 11.7–15.4)
WBC: 5.9 10*3/uL (ref 3.4–10.8)

## 2023-02-15 LAB — CMP14 + ANION GAP
ALT: 26 IU/L (ref 0–32)
AST: 26 IU/L (ref 0–40)
Albumin/Globulin Ratio: 1.9 (ref 1.2–2.2)
Albumin: 4.2 g/dL (ref 3.9–4.9)
Alkaline Phosphatase: 80 IU/L (ref 44–121)
Anion Gap: 17 mmol/L (ref 10.0–18.0)
BUN/Creatinine Ratio: 23 (ref 12–28)
BUN: 36 mg/dL — ABNORMAL HIGH (ref 8–27)
Bilirubin Total: 0.3 mg/dL (ref 0.0–1.2)
CO2: 27 mmol/L (ref 20–29)
Calcium: 9.2 mg/dL (ref 8.7–10.3)
Chloride: 98 mmol/L (ref 96–106)
Creatinine, Ser: 1.58 mg/dL — ABNORMAL HIGH (ref 0.57–1.00)
Globulin, Total: 2.2 g/dL (ref 1.5–4.5)
Glucose: 148 mg/dL — ABNORMAL HIGH (ref 70–99)
Potassium: 2.9 mmol/L — ABNORMAL LOW (ref 3.5–5.2)
Sodium: 142 mmol/L (ref 134–144)
Total Protein: 6.4 g/dL (ref 6.0–8.5)
eGFR: 35 mL/min/{1.73_m2} — ABNORMAL LOW (ref >59)

## 2023-02-15 LAB — TSH: TSH: 2.26 u[IU]/mL (ref 0.450–4.500)

## 2023-02-15 LAB — C-REACTIVE PROTEIN: CRP: 13 mg/L — ABNORMAL HIGH (ref 0–10)

## 2023-02-15 MED ORDER — POTASSIUM CHLORIDE CRYS ER 20 MEQ PO TBCR: 20.0000 meq | EXTENDED_RELEASE_TABLET | Freq: Every day | ORAL | 0 refills | Status: DC

## 2023-02-15 NOTE — Addendum Note (Signed)
Addended byLyndle Herrlich on: 02/15/2023 03:35 PM   Modules accepted: Orders

## 2023-02-15 NOTE — Assessment & Plan Note (Signed)
Addendum: K 2.9 at this visit likely due to poor PO intake. Will send daily supplement and recheck after 1 week

## 2023-02-18 NOTE — Progress Notes (Signed)
Internal Medicine Clinic Attending  Case discussed with the resident at the time of the visit.  We reviewed the resident's history and exam and pertinent patient test results.  I agree with the assessment, diagnosis, and plan of care documented in the resident's note.  

## 2023-02-21 ENCOUNTER — Other Ambulatory Visit: Payer: Self-pay | Admitting: Student

## 2023-02-26 ENCOUNTER — Other Ambulatory Visit (INDEPENDENT_AMBULATORY_CARE_PROVIDER_SITE_OTHER): Payer: PPO

## 2023-02-26 DIAGNOSIS — E876 Hypokalemia: Secondary | ICD-10-CM

## 2023-02-27 LAB — BMP8+ANION GAP
Anion Gap: 18 mmol/L (ref 10.0–18.0)
BUN/Creatinine Ratio: 25 (ref 12–28)
BUN: 35 mg/dL — ABNORMAL HIGH (ref 8–27)
CO2: 21 mmol/L (ref 20–29)
Calcium: 9.5 mg/dL (ref 8.7–10.3)
Chloride: 101 mmol/L (ref 96–106)
Creatinine, Ser: 1.42 mg/dL — ABNORMAL HIGH (ref 0.57–1.00)
Glucose: 202 mg/dL — ABNORMAL HIGH (ref 70–99)
Potassium: 4.3 mmol/L (ref 3.5–5.2)
Sodium: 140 mmol/L (ref 134–144)
eGFR: 40 mL/min/{1.73_m2} — ABNORMAL LOW (ref >59)

## 2023-03-07 ENCOUNTER — Telehealth: Payer: Self-pay

## 2023-03-07 NOTE — Telephone Encounter (Signed)
Contacted Stephanie Davis to schedule their annual wellness visit. Appointment made for 03/27/2023.  [facility] Care Guide [facility] AWV TEAM Direct Dial: [phone]

## 2023-03-19 ENCOUNTER — Other Ambulatory Visit: Payer: Self-pay

## 2023-03-19 DIAGNOSIS — I10 Essential (primary) hypertension: Secondary | ICD-10-CM

## 2023-03-21 IMAGING — DX DG FINGER THUMB 2+V*L*
3 series · 3 of 3 positions shown · non-contrast
Comparison: None.

CLINICAL DATA: Trauma.  Unable to bend thumb.

EXAM:
LEFT THUMB 2+V

[finger ap]
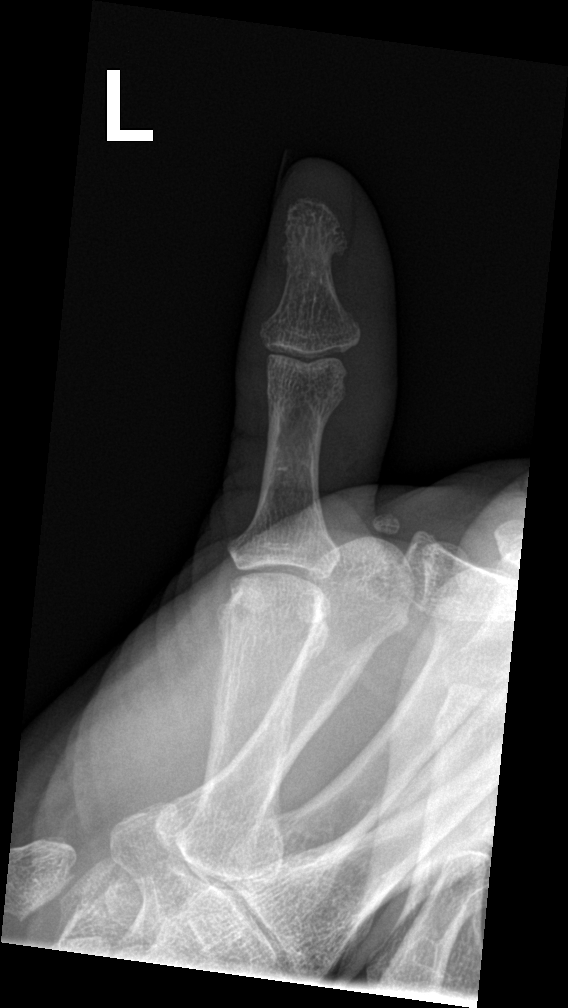

[finger obl]
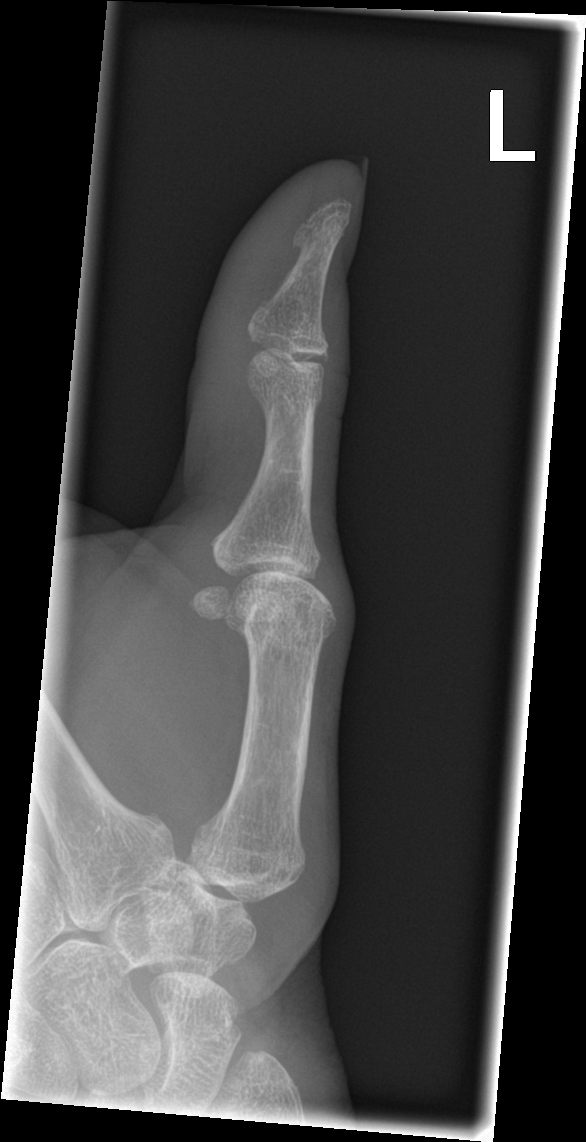

[finger lat]
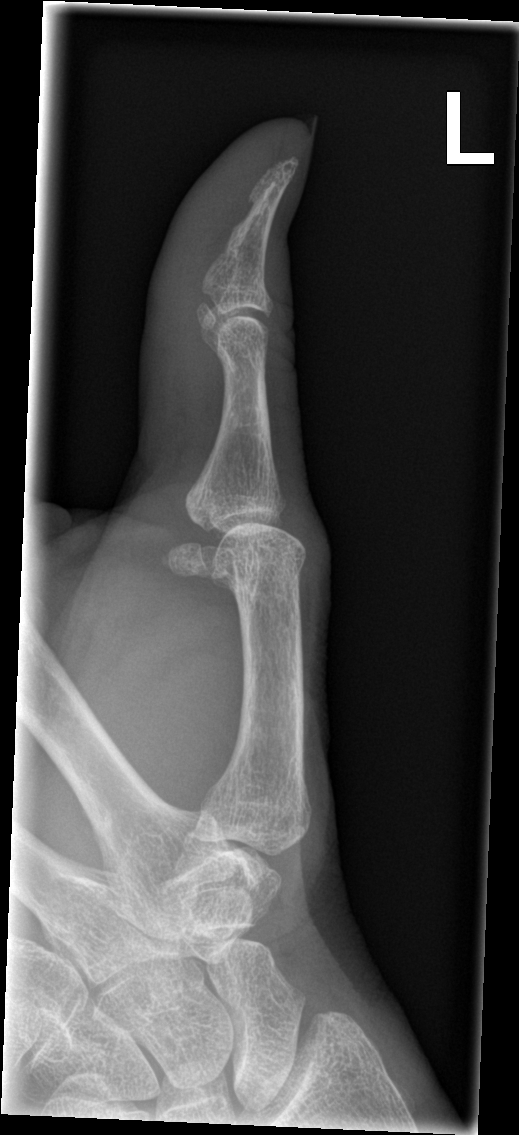

[3 of 3 positions shown; findings below may reference images not displayed]

FINDINGS: There is no evidence of fracture or dislocation. There is no
evidence of arthropathy or other focal bone abnormality. Soft
tissues are unremarkable.
IMPRESSION: Negative.

## 2023-03-27 ENCOUNTER — Ambulatory Visit (INDEPENDENT_AMBULATORY_CARE_PROVIDER_SITE_OTHER): Payer: PPO

## 2023-03-27 VITALS — Wt 106.0 lb

## 2023-03-27 DIAGNOSIS — Z0000 Encounter for general adult medical examination without abnormal findings: Secondary | ICD-10-CM
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
## 2023-03-27 NOTE — Patient Instructions (Signed)
Stephanie Davis , Thank you for taking time to come for your Medicare Wellness Visit. I appreciate your ongoing commitment to your health goals. Please review the following plan we discussed and let me know if I can assist you in the future.   These are the goals we discussed:  Goals       Patient Stated     Exercise 3x per week (30 min per time) (pt-stated)      Walking/marching/seated and standing exercises with exercise band.      Other     Blood Pressure < 140/90      HEMOGLOBIN A1C < 7      LDL CALC < 100        This is a list of the screening recommended for you and due dates:  Health Maintenance  Topic Date Due   Zoster (Shingles) Vaccine (1 of 2) Never done   Stool Blood Test  Never done   Colon Cancer Screening  06/15/2013   Mammogram  06/20/2014   DTaP/Tdap/Td vaccine (2 - Tdap) 01/31/2015   DEXA scan (bone density measurement)  05/10/2018   Lipid (cholesterol) test  01/17/2023   Eye exam for diabetics  01/17/2023   Pneumonia Vaccine (2 of 2 - PCV) 08/31/2023*   COVID-19 Vaccine (1) 04/11/2024*   Hemoglobin A1C  05/15/2023   Flu Shot  05/30/2023   Yearly kidney health urinalysis for diabetes  07/20/2023   Complete foot exam   02/13/2024   Yearly kidney function blood test for diabetes  02/26/2024   Medicare Annual Wellness Visit  03/26/2024   Hepatitis C Screening  Completed   HPV Vaccine  Aged Out  *Topic was postponed. The date shown is not the original due date.    Advanced directives: Please bring a copy of your health care power of attorney and living will to the office to be added to your chart at your convenience.   Conditions/risks identified: Diabetes Mellitus and Nutrition, Adult When you have diabetes, or diabetes mellitus, it is very important to have healthy eating habits because your blood sugar (glucose) levels are greatly affected by what you eat and drink. Eating healthy foods in the right amounts, at about the same times every day, can help  you: Manage your blood glucose. Lower your risk of heart disease. Improve your blood pressure. Reach or maintain a healthy weight. What can affect my meal plan? Every person with diabetes is different, and each person has different needs for a meal plan. Your health care provider may recommend that you work with a dietitian to make a meal plan that is best for you. Your meal plan may vary depending on factors such as: The calories you need. The medicines you take. Your weight. Your blood glucose, blood pressure, and cholesterol levels. Your activity level. Other health conditions you have, such as heart or kidney disease. How do carbohydrates affect me? Carbohydrates, also called carbs, affect your blood glucose level more than any other type of food. Eating carbs raises the amount of glucose in your blood. It is important to know how many carbs you can safely have in each meal. This is different for every person. Your dietitian can help you calculate how many carbs you should have at each meal and for each snack. How does alcohol affect me? Alcohol can cause a decrease in blood glucose (hypoglycemia), especially if you use insulin or take certain diabetes medicines by mouth. Hypoglycemia can be a life-threatening condition. Symptoms of hypoglycemia,  such as sleepiness, dizziness, and confusion, are similar to symptoms of having too much alcohol. Do not drink alcohol if: Your health care provider tells you not to drink. You are pregnant, may be pregnant, or are planning to become pregnant. If you drink alcohol: Limit how much you have to: 0-1 drink a day for women. 0-2 drinks a day for men. Know how much alcohol is in your drink. In the [place] one drink equals one 12 oz bottle of beer (355 mL), one 5 oz glass of wine (148 mL), or one 1 oz glass of hard liquor (44 mL). Keep yourself hydrated with water, diet soda, or unsweetened iced tea. Keep in mind that regular soda, juice, and other  mixers may contain a lot of sugar and must be counted as carbs. What are tips for following this plan?  Reading food labels Start by checking the serving size on the Nutrition Facts label of packaged foods and drinks. The number of calories and the amount of carbs, fats, and other nutrients listed on the label are based on one serving of the item. Many items contain more than one serving per package. Check the total grams (g) of carbs in one serving. Check the number of grams of saturated fats and trans fats in one serving. Choose foods that have a low amount or none of these fats. Check the number of milligrams (mg) of salt (sodium) in one serving. Most people should limit total sodium intake to less than 2,300 mg per day. Always check the nutrition information of foods labeled as "low-fat" or "nonfat." These foods may be higher in added sugar or refined carbs and should be avoided. Talk to your dietitian to identify your daily goals for nutrients listed on the label. Shopping Avoid buying canned, pre-made, or processed foods. These foods tend to be high in fat, sodium, and added sugar. Shop around the outside edge of the grocery store. This is where you will most often find fresh fruits and vegetables, bulk grains, fresh meats, and fresh dairy products. Cooking Use low-heat cooking methods, such as baking, instead of high-heat cooking methods, such as deep frying. Cook using healthy oils, such as olive, canola, or sunflower oil. Avoid cooking with butter, cream, or high-fat meats. Meal planning Eat meals and snacks regularly, preferably at the same times every day. Avoid going long periods of time without eating. Eat foods that are high in fiber, such as fresh fruits, vegetables, beans, and whole grains. Eat 4-6 oz (112-168 g) of lean protein each day, such as lean meat, chicken, fish, eggs, or tofu. One ounce (oz) (28 g) of lean protein is equal to: 1 oz (28 g) of meat, chicken, or fish. 1  egg.  cup (62 g) of tofu. Eat some foods each day that contain healthy fats, such as avocado, nuts, seeds, and fish. What foods should I eat? Fruits Berries. Apples. Oranges. Peaches. Apricots. Plums. Grapes. Mangoes. Papayas. Pomegranates. Kiwi. Cherries. Vegetables Leafy greens, including lettuce, spinach, kale, chard, collard greens, mustard greens, and cabbage. Beets. Cauliflower. Broccoli. Carrots. Green beans. Tomatoes. Peppers. Onions. Cucumbers. Brussels sprouts. Grains Whole grains, such as whole-wheat or whole-grain bread, crackers, tortillas, cereal, and pasta. Unsweetened oatmeal. Quinoa. Brown or wild rice. Meats and other proteins Seafood. Poultry without skin. Lean cuts of poultry and beef. Tofu. Nuts. Seeds. Dairy Low-fat or fat-free dairy products such as milk, yogurt, and cheese. The items listed above may not be a complete list of foods and beverages you can eat  and drink. Contact a dietitian for more information. What foods should I avoid? Fruits Fruits canned with syrup. Vegetables Canned vegetables. Frozen vegetables with butter or cream sauce. Grains Refined white flour and flour products such as bread, pasta, snack foods, and cereals. Avoid all processed foods. Meats and other proteins Fatty cuts of meat. Poultry with skin. Breaded or fried meats. Processed meat. Avoid saturated fats. Dairy Full-fat yogurt, cheese, or milk. Beverages Sweetened drinks, such as soda or iced tea. The items listed above may not be a complete list of foods and beverages you should avoid. Contact a dietitian for more information. Questions to ask a health care provider Do I need to meet with a certified diabetes care and education specialist? Do I need to meet with a dietitian? What number can I call if I have questions? When are the best times to check my blood glucose? Where to find more information: American Diabetes Association: diabetes.org Academy of Nutrition and  Dietetics: eatright.[employer] of Diabetes and Digestive and Kidney Diseases: StageSync.si Association of Diabetes Care & Education Specialists: diabeteseducator.org Summary It is important to have healthy eating habits because your blood sugar (glucose) levels are greatly affected by what you eat and drink. It is important to use alcohol carefully. A healthy meal plan will help you manage your blood glucose and lower your risk of heart disease. Your health care provider may recommend that you work with a dietitian to make a meal plan that is best for you. This information is not intended to replace advice given to you by your health care provider. Make sure you discuss any questions you have with your health care provider. Document Revised: 05/18/2020 Document Reviewed: 05/18/2020 Elsevier Patient Education  2024 Elsevier Inc.   Next appointment: Follow up in one year for your annual wellness visit 03/27/24   Preventive Care 65 Years and Older, Female Preventive care refers to lifestyle choices and visits with your health care provider that can promote health and wellness. What does preventive care include? A yearly physical exam. This is also called an annual well check. Dental exams once or twice a year. Routine eye exams. Ask your health care provider how often you should have your eyes checked. Personal lifestyle choices, including: Daily care of your teeth and gums. Regular physical activity. Eating a healthy diet. Avoiding tobacco and drug use. Limiting alcohol use. Practicing safe sex. Taking low-dose aspirin every day. Taking vitamin and mineral supplements as recommended by your health care provider. What happens during an annual well check? The services and screenings done by your health care provider during your annual well check will depend on your age, overall health, lifestyle risk factors, and family history of disease. Counseling  Your health care  provider may ask you questions about your: Alcohol use. Tobacco use. Drug use. Emotional well-being. Home and relationship well-being. Sexual activity. Eating habits. History of falls. Memory and ability to understand (cognition). Work and work Astronomer. Reproductive health. Screening  You may have the following tests or measurements: Height, weight, and BMI. Blood pressure. Lipid and cholesterol levels. These may be checked every 5 years, or more frequently if you are over 40 years old. Skin check. Lung cancer screening. You may have this screening every year starting at age 29 if you have a 30-pack-year history of smoking and currently smoke or have quit within the past 15 years. Fecal occult blood test (FOBT) of the stool. You may have this test every year starting at age 55.  Flexible sigmoidoscopy or colonoscopy. You may have a sigmoidoscopy every 5 years or a colonoscopy every 10 years starting at age 2. Hepatitis C blood test. Hepatitis B blood test. Sexually transmitted disease (STD) testing. Diabetes screening. This is done by checking your blood sugar (glucose) after you have not eaten for a while (fasting). You may have this done every 1-3 years. Bone density scan. This is done to screen for osteoporosis. You may have this done starting at age 52. Mammogram. This may be done every 1-2 years. Talk to your health care provider about how often you should have regular mammograms. Talk with your health care provider about your test results, treatment options, and if necessary, the need for more tests. Vaccines  Your health care provider may recommend certain vaccines, such as: Influenza vaccine. This is recommended every year. Tetanus, diphtheria, and acellular pertussis (Tdap, Td) vaccine. You may need a Td booster every 10 years. Zoster vaccine. You may need this after age 75. Pneumococcal 13-valent conjugate (PCV13) vaccine. One dose is recommended after age  67. Pneumococcal polysaccharide (PPSV23) vaccine. One dose is recommended after age 46. Talk to your health care provider about which screenings and vaccines you need and how often you need them. This information is not intended to replace advice given to you by your health care provider. Make sure you discuss any questions you have with your health care provider. Document Released: 11/11/2015 Document Revised: 07/04/2016 Document Reviewed: 08/16/2015 Elsevier Interactive Patient Education  2017 [employer].  Fall Prevention in the Home Falls can cause injuries. They can happen to people of all ages. There are many things you can do to make your home safe and to help prevent falls. What can I do on the outside of my home? Regularly fix the edges of walkways and driveways and fix any cracks. Remove anything that might make you trip as you walk through a door, such as a raised step or threshold. Trim any bushes or trees on the path to your home. Use bright outdoor lighting. Clear any walking paths of anything that might make someone trip, such as rocks or tools. Regularly check to see if handrails are loose or broken. Make sure that both sides of any steps have handrails. Any raised decks and porches should have guardrails on the edges. Have any leaves, snow, or ice cleared regularly. Use sand or salt on walking paths during winter. Clean up any spills in your garage right away. This includes oil or grease spills. What can I do in the bathroom? Use night lights. Install grab bars by the toilet and in the tub and shower. Do not use towel bars as grab bars. Use non-skid mats or decals in the tub or shower. If you need to sit down in the shower, use a plastic, non-slip stool. Keep the floor dry. Clean up any water that spills on the floor as soon as it happens. Remove soap buildup in the tub or shower regularly. Attach bath mats securely with double-sided non-slip rug tape. Do not have throw  rugs and other things on the floor that can make you trip. What can I do in the bedroom? Use night lights. Make sure that you have a light by your bed that is easy to reach. Do not use any sheets or blankets that are too big for your bed. They should not hang down onto the floor. Have a firm chair that has side arms. You can use this for support while you get  dressed. Do not have throw rugs and other things on the floor that can make you trip. What can I do in the kitchen? Clean up any spills right away. Avoid walking on wet floors. Keep items that you use a lot in easy-to-reach places. If you need to reach something above you, use a strong step stool that has a grab bar. Keep electrical cords out of the way. Do not use floor polish or wax that makes floors slippery. If you must use wax, use non-skid floor wax. Do not have throw rugs and other things on the floor that can make you trip. What can I do with my stairs? Do not leave any items on the stairs. Make sure that there are handrails on both sides of the stairs and use them. Fix handrails that are broken or loose. Make sure that handrails are as long as the stairways. Check any carpeting to make sure that it is firmly attached to the stairs. Fix any carpet that is loose or worn. Avoid having throw rugs at the top or bottom of the stairs. If you do have throw rugs, attach them to the floor with carpet tape. Make sure that you have a light switch at the top of the stairs and the bottom of the stairs. If you do not have them, ask someone to add them for you. What else can I do to help prevent falls? Wear shoes that: Do not have high heels. Have rubber bottoms. Are comfortable and fit you well. Are closed at the toe. Do not wear sandals. If you use a stepladder: Make sure that it is fully opened. Do not climb a closed stepladder. Make sure that both sides of the stepladder are locked into place. Ask someone to hold it for you, if  possible. Clearly mark and make sure that you can see: Any grab bars or handrails. First and last steps. Where the edge of each step is. Use tools that help you move around (mobility aids) if they are needed. These include: Canes. Walkers. Scooters. Crutches. Turn on the lights when you go into a dark area. Replace any light bulbs as soon as they burn out. Set up your furniture so you have a clear path. Avoid moving your furniture around. If any of your floors are uneven, fix them. If there are any pets around you, be aware of where they are. Review your medicines with your doctor. Some medicines can make you feel dizzy. This can increase your chance of falling. Ask your doctor what other things that you can do to help prevent falls. This information is not intended to replace advice given to you by your health care provider. Make sure you discuss any questions you have with your health care provider. Document Released: 08/11/2009 Document Revised: 03/22/2016 Document Reviewed: 11/19/2014 Elsevier Interactive Patient Education  2017 [employer].

## 2023-04-02 ENCOUNTER — Encounter: Payer: Self-pay | Admitting: *Deleted

## 2023-04-08 ENCOUNTER — Other Ambulatory Visit: Payer: Self-pay

## 2023-04-08 DIAGNOSIS — E1122 Type 2 diabetes mellitus with diabetic chronic kidney disease: Secondary | ICD-10-CM

## 2023-04-09 MED ORDER — METFORMIN HCL 1000 MG PO TABS
1000.0000 mg | ORAL_TABLET | Freq: Two times a day (BID) | ORAL | 0 refills | Status: DC
Start: 2023-04-09 — End: 2023-10-03

## 2023-04-18 ENCOUNTER — Other Ambulatory Visit: Payer: Self-pay

## 2023-05-29 ENCOUNTER — Encounter: Payer: PPO | Admitting: Student

## 2023-05-30 ENCOUNTER — Other Ambulatory Visit: Payer: Self-pay

## 2023-05-30 DIAGNOSIS — Z794 Long term (current) use of insulin: Secondary | ICD-10-CM

## 2023-05-30 DIAGNOSIS — Z8659 Personal history of other mental and behavioral disorders: Secondary | ICD-10-CM

## 2023-05-30 MED ORDER — MIRTAZAPINE 7.5 MG PO TABS: 7.5000 mg | ORAL_TABLET | Freq: Every day | ORAL | 2 refills | Status: DC

## 2023-05-30 MED ORDER — EMPAGLIFLOZIN 10 MG PO TABS: 10.0000 mg | ORAL_TABLET | Freq: Every day | ORAL | 2 refills | Status: DC

## 2023-06-13 ENCOUNTER — Encounter: Payer: Self-pay | Admitting: Pharmacist

## 2023-06-13 NOTE — Progress Notes (Signed)
Adherence check-in. After speaking with pharmacy, patient appears adherent to both lisinopril and empagliflozin. Atorvastatin appears overdue as last fill was 05/10/23 for 30 day supply. No My Chart access available. Phone call to patient. Left voicemail reminder for refilling cholesterol medication.

## 2023-07-09 ENCOUNTER — Encounter: Payer: Self-pay | Admitting: Internal Medicine

## 2023-07-09 ENCOUNTER — Ambulatory Visit (INDEPENDENT_AMBULATORY_CARE_PROVIDER_SITE_OTHER): Payer: PPO | Admitting: Internal Medicine

## 2023-07-09 VITALS — BP 125/84 | HR 76 | Temp 98.4°F | Wt 103.2 lb

## 2023-07-09 DIAGNOSIS — R251 Tremor, unspecified: Secondary | ICD-10-CM

## 2023-07-09 DIAGNOSIS — N183 Chronic kidney disease, stage 3 unspecified: Secondary | ICD-10-CM

## 2023-07-09 DIAGNOSIS — R634 Abnormal weight loss: Secondary | ICD-10-CM

## 2023-07-09 DIAGNOSIS — N1832 Chronic kidney disease, stage 3b: Secondary | ICD-10-CM

## 2023-07-09 DIAGNOSIS — Z8659 Personal history of other mental and behavioral disorders: Secondary | ICD-10-CM

## 2023-07-09 DIAGNOSIS — I10 Essential (primary) hypertension: Secondary | ICD-10-CM

## 2023-07-09 DIAGNOSIS — I129 Hypertensive chronic kidney disease with stage 1 through stage 4 chronic kidney disease, or unspecified chronic kidney disease: Secondary | ICD-10-CM | POA: Diagnosis not present

## 2023-07-09 DIAGNOSIS — Z23 Encounter for immunization: Secondary | ICD-10-CM | POA: Diagnosis not present

## 2023-07-09 DIAGNOSIS — Z794 Long term (current) use of insulin: Secondary | ICD-10-CM

## 2023-07-09 DIAGNOSIS — Z7984 Long term (current) use of oral hypoglycemic drugs: Secondary | ICD-10-CM

## 2023-07-09 DIAGNOSIS — E1122 Type 2 diabetes mellitus with diabetic chronic kidney disease: Secondary | ICD-10-CM

## 2023-07-09 DIAGNOSIS — Z0000 Encounter for general adult medical examination without abnormal findings: Secondary | ICD-10-CM

## 2023-07-09 LAB — POCT GLYCOSYLATED HEMOGLOBIN (HGB A1C): Hemoglobin A1C: 7.6 % — AB (ref 4.0–5.6)

## 2023-07-09 LAB — GLUCOSE, CAPILLARY: Glucose-Capillary: 182 mg/dL — ABNORMAL HIGH (ref 70–99)

## 2023-07-09 MED ORDER — CHLORTHALIDONE 50 MG PO TABS: 50.0000 mg | ORAL_TABLET | Freq: Every day | ORAL | 2 refills | Status: DC

## 2023-07-09 NOTE — Assessment & Plan Note (Addendum)
A1c today 7.6 from 7.1 at last visit.  Current diabetic regimen includes metformin 1000 twice daily and Jardiance 10.  Previously was on Jardiance 25, though she felt this decreased her appetite too much. A1c goal can be >8.0 as low BMI is a consideration in this patient.  As we are working on increasing oral intake, I will continue her on Jardiance 10 and encourage Ensure shakes and high-protein diet. -Continue current regimen -Ophthalmology consult for eye exam

## 2023-07-09 NOTE — Assessment & Plan Note (Signed)
No tremor noted today. She felt in the past she had a tremor when her blood sugar was high, though has not had this problem in a while.

## 2023-07-09 NOTE — Assessment & Plan Note (Signed)
Patient feels her mood is overall improved. Her husband passed away two years ago which was a big shock to her. She has good family support, with frequent visits from her daughters and grandchildren.  -Continue mirtazapine 7.5mg  daily

## 2023-07-09 NOTE — Assessment & Plan Note (Signed)
Patient's weight today is 103.8 pounds, down from 106 pounds at last visit 5 months.  Patient states she has been eating normally and has an appropriate appetite.  She has been trying to drink Ensure protein shakes daily.  Adamantly refuses mammogram or colonoscopy, which were previously referred.  She is continuing to take mirtazapine daily. -Continue current dosage of Jardiance as higher dosage decreased appetite

## 2023-07-09 NOTE — Assessment & Plan Note (Signed)
Flu shot today 

## 2023-07-09 NOTE — Assessment & Plan Note (Signed)
Patient's blood pressure today was 135/92.  Current regimen includes amlodipine 5, chlorthalidone 50, losartan 50 daily.  Blood pressure within goal, I will continue current antihypertensive regimen.

## 2023-07-09 NOTE — Progress Notes (Addendum)
Subjective:  CC: routine visit  HPI:  [name] is a 72 y.o. female with a past medical history stated below and presents today for Diabetes, medications and flu vaccine. Please see problem based assessment and plan for additional details.  Past Medical History:  Diagnosis Date   CKD (chronic kidney disease), stage III (HCC)    Diabetes mellitus    History of depression    Hyperlipidemia    Hyperparathyroidism    ? h/o hyperCa, no PTH level in chart, uptake study was negative; did not have intervention   Hypertension    Tremor of right hand 11/24/2020    Current Outpatient Medications on File Prior to Visit  Medication Sig Dispense Refill   amLODipine (NORVASC) 5 MG tablet Take 1 tablet by mouth once daily 30 tablet 11   atorvastatin (LIPITOR) 10 MG tablet Take 1 tablet (10 mg total) by mouth daily. 30 tablet 11   Butenafine HCl 1 % cream Apply topically 2 (two) times daily.     chlorthalidone (HYGROTON) 50 MG tablet Take 1 tablet by mouth once daily 90 tablet 0   cholecalciferol (VITAMIN D3) 25 MCG (1000 UNIT) tablet Take 1 tablet (1,000 Units total) by mouth daily. 30 tablet 2   empagliflozin (JARDIANCE) 10 MG TABS tablet Take 1 tablet (10 mg total) by mouth daily before breakfast. 30 tablet 2   glucose blood (ONETOUCH VERIO) test strip Check twice a day with breakfast and at dinner. 200 each 11   Lancets (ONETOUCH DELICA PLUS LANCET33G) MISC Check blood sugar 2 times a day 200 each 3   losartan (COZAAR) 50 MG tablet Take 1 tablet by mouth once daily 30 tablet 11   metFORMIN (GLUCOPHAGE) 1000 MG tablet Take 1 tablet (1,000 mg total) by mouth 2 (two) times daily with a meal. 180 tablet 0   mirtazapine (REMERON) 7.5 MG tablet Take 1 tablet (7.5 mg total) by mouth at bedtime. 30 tablet 2   Multiple Vitamins-Minerals (MULTIVITAL PERFORMANCE PO) Take 1 tablet by mouth daily.     potassium chloride SA (KLOR-CON M) 20 MEQ tablet Take 1 tablet (20 mEq total) by mouth daily  for 7 days. 7 tablet 0   Turmeric (QC TUMERIC COMPLEX PO) Take 1 capsule by mouth daily.     Turmeric 400 MG CAPS Take 1 capsule by mouth daily.     No current facility-administered medications on file prior to visit.    Review of Systems: ROS negative except for as is noted on the assessment and plan.  Objective:   Vitals:   07/09/23 0945  BP: (!) 135/92  Pulse: 80  Temp: 98.4 F (36.9 C)  TempSrc: Oral  Weight: 103 lb 3.2 oz (46.8 kg)    Physical Exam: Constitutional: well-appearing, in no acute distress HENT: normocephalic atraumatic, mucous membranes moist Eyes: conjunctiva non-erythematous Neck: supple Cardiovascular: regular rate and rhythm, no m/r/g Pulmonary/Chest: normal work of breathing on room air, lungs clear to auscultation bilaterally Abdominal: soft, non-tender, non-distended MSK: normal bulk and tone Neurological: alert & oriented x 3, 5/5 strength in bilateral upper and lower extremities, normal gait Skin: warm and dry  Assessment & Plan:   Essential hypertension Patient's blood pressure today was 135/92.  Current regimen includes amlodipine 5, chlorthalidone 50, losartan 50 daily.  Blood pressure within goal, I will continue current antihypertensive regimen.  Type 2 diabetes mellitus with stage 3 chronic kidney disease (HCC) A1c today 7.6 from 7.1 at last visit.  Current diabetic regimen  includes metformin 1000 twice daily and Jardiance 10.  Previously was on Jardiance 25, though she felt this decreased her appetite too much. A1c goal can be >8.0 as low BMI is a consideration in this patient.  As we are working on increasing oral intake, I will continue her on Jardiance 10 and encourage Ensure shakes and high-protein diet. -Continue current regimen -Ophthalmology consult for eye exam  Healthcare maintenance Flu shot today  Abnormal weight loss Patient's weight today is 103.8 pounds, down from 106 pounds at last visit 5 months.  Patient states she has  been eating normally and has an appropriate appetite.  She has been trying to drink Ensure protein shakes daily.  Adamantly refuses mammogram or colonoscopy, which were previously referred.  She is continuing to take mirtazapine daily. -Continue current dosage of Jardiance as higher dosage decreased appetite  History of depression Patient feels her mood is overall improved. Her husband passed away two years ago which was a big shock to her. She has good family support, with frequent visits from her daughters and grandchildren.  -Continue mirtazapine 7.5mg  daily  Tremor of right hand No tremor noted today. She felt in the past she had a tremor when her blood sugar was high, though has not had this problem in a while.    Patient seen with Dr. Bari Edward MD [facility]  [id number] Pager: [phone] Date 07/09/2023  Time 10:22 AM

## 2023-07-09 NOTE — Patient Instructions (Signed)
Stephanie Davis,   It was a pleasure meeting you today.  To summarize our visit, I will keep all of your medications at the current dosage.  I will reorder your chlorthalidone.  Please continue taking Jardiance 10 mg daily.  I will send in a referral for an eye exam, please be on the look out for their call.  We will plan on seeing you in the clinic again in 6 months.  Thank you, Monna Fam, MD

## 2023-07-11 ENCOUNTER — Other Ambulatory Visit: Payer: Self-pay

## 2023-07-11 DIAGNOSIS — E785 Hyperlipidemia, unspecified: Secondary | ICD-10-CM

## 2023-07-11 DIAGNOSIS — E1122 Type 2 diabetes mellitus with diabetic chronic kidney disease: Secondary | ICD-10-CM

## 2023-07-11 MED ORDER — ATORVASTATIN CALCIUM 10 MG PO TABS
10.0000 mg | ORAL_TABLET | Freq: Every day | ORAL | 11 refills | Status: DC
Start: 2023-07-11 — End: 2024-06-17

## 2023-07-23 NOTE — Progress Notes (Signed)
 Internal Medicine Clinic Attending  I was physically present during the key portions of the resident provided service and participated in the medical decision making of patient's management care. I reviewed pertinent patient test results.  The assessment, diagnosis, and plan were formulated together and I agree with the documentation in the resident's note.  Inez Catalina, MD

## 2023-07-23 NOTE — Addendum Note (Signed)
Addended by: Debe Coder B on: 07/23/2023 02:36 PM   Modules accepted: Level of Service

## 2023-08-07 ENCOUNTER — Ambulatory Visit: Payer: PPO

## 2023-08-07 VITALS — Ht 64.0 in | Wt 107.0 lb

## 2023-08-07 DIAGNOSIS — Z0000 Encounter for general adult medical examination without abnormal findings: Secondary | ICD-10-CM | POA: Diagnosis not present

## 2023-08-07 NOTE — Patient Instructions (Addendum)
Stephanie Davis , Thank you for taking time to come for your Medicare Wellness Visit. I appreciate your ongoing commitment to your health goals. Please review the following plan we discussed and let me know if I can assist you in the future.   Referrals/Orders/Follow-Ups/Clinician Recommendations: You are due for a Tetanus vaccine.  You have orders in for a up to date mammogram, a Bone density screening and a diabetic eye exam.  Please call [facility] at [phone].  Also remember to do the Cologuard kit as soon as you can and return it in the mail.  Call Dr. Laruth Bouchard office to schedule a visit for your eye exam as well.  It was nice to speak with you today.  Take care.  This is a list of the screening recommended for you and due dates:  Health Maintenance  Topic Date Due   Zoster (Shingles) Vaccine (1 of 2) Never done   Stool Blood Test  Never done   Colon Cancer Screening  06/15/2013   Mammogram  06/20/2014   DTaP/Tdap/Td vaccine (2 - Tdap) 01/31/2015   DEXA scan (bone density measurement)  05/10/2018   Lipid (cholesterol) test  01/17/2023   Eye exam for diabetics  01/17/2023   Yearly kidney health urinalysis for diabetes  07/20/2023   Pneumonia Vaccine (2 of 2 - PCV) 08/31/2023*   COVID-19 Vaccine (1) 04/11/2024*   Hemoglobin A1C  10/08/2023   Complete foot exam   02/13/2024   Yearly kidney function blood test for diabetes  02/26/2024   Medicare Annual Wellness Visit  08/06/2024   Flu Shot  Completed   Hepatitis C Screening  Completed   HPV Vaccine  Aged Out  *Topic was postponed. The date shown is not the original due date.    Advanced directives: (Copy Requested) Please bring a copy of your health care power of attorney and living will to the office to be added to your chart at your convenience.  Next Medicare Annual Wellness Visit scheduled for next year: Yes
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
## 2023-09-10 ENCOUNTER — Other Ambulatory Visit: Payer: Self-pay

## 2023-09-10 DIAGNOSIS — Z8659 Personal history of other mental and behavioral disorders: Secondary | ICD-10-CM

## 2023-09-10 MED ORDER — LOSARTAN POTASSIUM 50 MG PO TABS: 50.0000 mg | ORAL_TABLET | Freq: Every day | ORAL | 11 refills | Status: DC

## 2023-09-10 MED ORDER — MIRTAZAPINE 7.5 MG PO TABS: 7.5000 mg | ORAL_TABLET | Freq: Every day | ORAL | 2 refills | Status: DC

## 2023-09-10 NOTE — Telephone Encounter (Signed)
Patient called she was last seen 9/10 patient was instructed to return around 01/06/24. No fu scheduled.

## 2023-10-02 ENCOUNTER — Other Ambulatory Visit: Payer: Self-pay | Admitting: Student

## 2023-10-02 DIAGNOSIS — Z794 Long term (current) use of insulin: Secondary | ICD-10-CM

## 2023-10-03 ENCOUNTER — Other Ambulatory Visit: Payer: Self-pay

## 2023-10-03 DIAGNOSIS — Z794 Long term (current) use of insulin: Secondary | ICD-10-CM

## 2023-10-03 MED ORDER — METFORMIN HCL 1000 MG PO TABS
1000.0000 mg | ORAL_TABLET | Freq: Two times a day (BID) | ORAL | 0 refills | Status: DC
Start: 2023-10-03 — End: 2024-01-09

## 2023-10-30 ENCOUNTER — Other Ambulatory Visit: Payer: Self-pay | Admitting: Student

## 2023-10-30 DIAGNOSIS — N1832 Chronic kidney disease, stage 3b: Secondary | ICD-10-CM

## 2023-11-27 ENCOUNTER — Other Ambulatory Visit: Payer: Self-pay | Admitting: Student

## 2023-11-27 DIAGNOSIS — Z8659 Personal history of other mental and behavioral disorders: Secondary | ICD-10-CM

## 2023-11-27 DIAGNOSIS — Z794 Long term (current) use of insulin: Secondary | ICD-10-CM

## 2023-12-24 ENCOUNTER — Other Ambulatory Visit: Payer: Self-pay | Admitting: Student

## 2023-12-24 DIAGNOSIS — Z8659 Personal history of other mental and behavioral disorders: Secondary | ICD-10-CM

## 2023-12-24 DIAGNOSIS — Z794 Long term (current) use of insulin: Secondary | ICD-10-CM

## 2024-01-09 ENCOUNTER — Encounter: Payer: Self-pay | Admitting: Student

## 2024-01-09 ENCOUNTER — Ambulatory Visit: Admitting: Student

## 2024-01-09 VITALS — BP 133/64 | HR 55 | Temp 98.2°F

## 2024-01-09 DIAGNOSIS — Z7984 Long term (current) use of oral hypoglycemic drugs: Secondary | ICD-10-CM

## 2024-01-09 DIAGNOSIS — Z8659 Personal history of other mental and behavioral disorders: Secondary | ICD-10-CM | POA: Diagnosis not present

## 2024-01-09 DIAGNOSIS — N183 Chronic kidney disease, stage 3 unspecified: Secondary | ICD-10-CM | POA: Diagnosis not present

## 2024-01-09 DIAGNOSIS — E1122 Type 2 diabetes mellitus with diabetic chronic kidney disease: Secondary | ICD-10-CM

## 2024-01-09 DIAGNOSIS — Z1231 Encounter for screening mammogram for malignant neoplasm of breast: Secondary | ICD-10-CM

## 2024-01-09 DIAGNOSIS — I10 Essential (primary) hypertension: Secondary | ICD-10-CM | POA: Diagnosis not present

## 2024-01-09 DIAGNOSIS — Z794 Long term (current) use of insulin: Secondary | ICD-10-CM

## 2024-01-09 DIAGNOSIS — Z0000 Encounter for general adult medical examination without abnormal findings: Secondary | ICD-10-CM

## 2024-01-09 DIAGNOSIS — Z1239 Encounter for other screening for malignant neoplasm of breast: Secondary | ICD-10-CM | POA: Insufficient documentation

## 2024-01-09 LAB — POCT GLYCOSYLATED HEMOGLOBIN (HGB A1C): Hemoglobin A1C: 6.8 % — AB (ref 4.0–5.6)

## 2024-01-09 LAB — GLUCOSE, CAPILLARY: Glucose-Capillary: 153 mg/dL — ABNORMAL HIGH (ref 70–99)

## 2024-01-09 MED ORDER — METFORMIN HCL 1000 MG PO TABS: 1000.0000 mg | ORAL_TABLET | Freq: Two times a day (BID) | ORAL | 0 refills | Status: DC

## 2024-01-09 MED ORDER — METFORMIN HCL 1000 MG PO TABS: 1000.0000 mg | ORAL_TABLET | Freq: Every day | ORAL | 0 refills | Status: DC

## 2024-01-09 MED ORDER — MIRTAZAPINE 7.5 MG PO TABS: 7.5000 mg | ORAL_TABLET | Freq: Every day | ORAL | 0 refills | Status: DC

## 2024-01-09 MED ORDER — EMPAGLIFLOZIN 10 MG PO TABS: 10.0000 mg | ORAL_TABLET | Freq: Every day | ORAL | 0 refills | Status: DC

## 2024-01-09 NOTE — Assessment & Plan Note (Signed)
 Refilled mirtazapine. Depression is under control. She continues to mourn the loss of her husband of 50 years.

## 2024-01-09 NOTE — Assessment & Plan Note (Signed)
 A1c 7.6 -> 6.8. Good improvement. No problem with Jardiance and metformin (she is taking metformin 1000 every day rather than BID due to stomach upset). Will continue.  - metformin 1000 every day - Jardiance 10 every day - refuses eye exam and urine/ACR, but agreeable in future

## 2024-01-09 NOTE — Assessment & Plan Note (Signed)
 Well controlled. 133/64. Continue current treatment. - Amlodipine 5 daily - chlorthalidone 50 daily - losartan 50 daily

## 2024-01-09 NOTE — Assessment & Plan Note (Signed)
 Accepts mammogram referral. Placed today.

## 2024-01-09 NOTE — Assessment & Plan Note (Addendum)
 Pt declines labs and colonoscopy/stool testing at this time. Shared my recommendation for ongoing monitoring and screening. She is agreeable to labs in the future. She declines colon screening.

## 2024-01-09 NOTE — Progress Notes (Signed)
   CC: Follow up HTN, T2DM, chronic diseases  HPI:  Stephanie Davis is a 73 y.o. female living with a history stated below and presents today for checkup. Please see problem based assessment and plan for additional details.  Past Medical History:  Diagnosis Date   CKD (chronic kidney disease), stage III (HCC)    Diabetes mellitus    History of depression    Hyperlipidemia    Hyperparathyroidism    ? h/o hyperCa, no PTH level in chart, uptake study was negative; did not have intervention   Hypertension    Tremor of right hand 11/24/2020    Review of Systems: ROS negative except for what is noted on the assessment and plan.  Vitals:   01/09/24 1038  BP: 133/64  Pulse: (!) 55  Temp: 98.2 F (36.8 C)  TempSrc: Oral    Physical Exam: Constitutional: well-appearing woman sitting in wheelchair in no acute distress Cardiovascular: regular rate and rhythm, no m/r/g Pulmonary/Chest: normal work of breathing on room air, lungs clear to auscultation bilaterally Abdominal: soft, non-tender, non-distended MSK: normal bulk and tone Skin: warm and dry Psych: normal mood and behavior   Assessment & Plan:   Patient seen with Dr. Antony Contras  Type 2 diabetes mellitus with stage 3 chronic kidney disease (HCC) A1c 7.6 -> 6.8. Good improvement. No problem with Jardiance and metformin (she is taking metformin 1000 every day rather than BID due to stomach upset). Will continue.  - metformin 1000 every day - Jardiance 10 every day - refuses eye exam and urine/ACR, but agreeable in future  Essential hypertension Well controlled. 133/64. Continue current treatment. - Amlodipine 5 daily - chlorthalidone 50 daily - losartan 50 daily  Breast cancer screening Accepts mammogram referral. Placed today.  History of depression Refilled mirtazapine. Depression is under control. She continues to mourn the loss of her husband of 50 years.  Healthcare maintenance Pt declines labs and  colonoscopy/stool testing at this time. Shared my recommendation for ongoing monitoring and screening. She is agreeable to labs in the future. She declines colon screening.  RTC in 6 months for checkup of diabetes and HTN. Consider lipids, ACR at that time if able. She declined today.   Katheran James, D.O. [facility] Internal Medicine, PGY-1 Phone: [phone] Date 01/09/2024 Time 1:17 PM

## 2024-01-16 NOTE — Progress Notes (Signed)
 Internal Medicine Clinic Attending  Case discussed with the resident at the time of the visit.  We reviewed the resident's history and exam and pertinent patient test results.  I agree with the assessment, diagnosis, and plan of care documented in the resident's note.

## 2024-01-22 ENCOUNTER — Telehealth: Payer: Self-pay | Admitting: Pharmacist

## 2024-01-22 NOTE — Progress Notes (Signed)
   01/22/2024  Patient ID: Stephanie Davis, female   DOB: 04-12-1951, 73 y.o.   MRN: [id number]  Patient appeared on insurance report for not passing the quality metrics in 2024: Medication Adherence for Cholesterol (MAC) Medication Adherence for Diabetes (MAD) Medication Adherence for Hypertension [facility])   Outreach to the patient was Successful.    Patient has been adherence to medication pick-ups for Atorvastatin, Jardiance, and Losartan so far this year. However, her metformin may cause an issue with adherence due to patient taking it daily rather than twice a day due to stomach upset.   Script has already been updated at the pharmacy, but do not expect patient to pick-up new script until 6 months has passed and current fill. Will likely need again in June and set task for then.     Ricka Burdock, PharmD [facility]  Phone Number: [phone]

## 2024-02-04 ENCOUNTER — Telehealth: Payer: Self-pay | Admitting: *Deleted

## 2024-02-04 NOTE — Telephone Encounter (Signed)
 Lvm regarding mammogram scheduling for patient to give call back so can ask questions for scheduling of her mammogram.

## 2024-02-25 ENCOUNTER — Other Ambulatory Visit: Payer: Self-pay | Admitting: Student

## 2024-02-25 DIAGNOSIS — E1122 Type 2 diabetes mellitus with diabetic chronic kidney disease: Secondary | ICD-10-CM

## 2024-02-25 DIAGNOSIS — Z8659 Personal history of other mental and behavioral disorders: Secondary | ICD-10-CM

## 2024-03-18 ENCOUNTER — Other Ambulatory Visit: Payer: Self-pay | Admitting: Student

## 2024-03-18 ENCOUNTER — Other Ambulatory Visit: Payer: Self-pay | Admitting: Internal Medicine

## 2024-03-18 DIAGNOSIS — I10 Essential (primary) hypertension: Secondary | ICD-10-CM

## 2024-03-18 DIAGNOSIS — Z794 Long term (current) use of insulin: Secondary | ICD-10-CM

## 2024-03-18 NOTE — Telephone Encounter (Signed)
 Medication sent to pharmacy

## 2024-03-24 ENCOUNTER — Other Ambulatory Visit: Payer: Self-pay | Admitting: Student

## 2024-03-24 DIAGNOSIS — Z8659 Personal history of other mental and behavioral disorders: Secondary | ICD-10-CM

## 2024-03-24 DIAGNOSIS — E1122 Type 2 diabetes mellitus with diabetic chronic kidney disease: Secondary | ICD-10-CM

## 2024-03-27 ENCOUNTER — Other Ambulatory Visit: Payer: Self-pay | Admitting: Internal Medicine

## 2024-03-27 NOTE — Telephone Encounter (Signed)
 Medication sent to pharmacy

## 2024-04-27 ENCOUNTER — Other Ambulatory Visit: Payer: Self-pay | Admitting: Student

## 2024-04-27 DIAGNOSIS — Z8659 Personal history of other mental and behavioral disorders: Secondary | ICD-10-CM

## 2024-04-29 ENCOUNTER — Other Ambulatory Visit: Payer: Self-pay | Admitting: Student

## 2024-04-29 NOTE — Telephone Encounter (Signed)
 Medication sent to pharmacy

## 2024-05-11 ENCOUNTER — Ambulatory Visit (INDEPENDENT_AMBULATORY_CARE_PROVIDER_SITE_OTHER): Admitting: Student

## 2024-05-11 VITALS — BP 100/66 | HR 59 | Temp 98.2°F | Ht 64.0 in | Wt 95.6 lb

## 2024-05-11 DIAGNOSIS — E1122 Type 2 diabetes mellitus with diabetic chronic kidney disease: Secondary | ICD-10-CM

## 2024-05-11 DIAGNOSIS — N183 Chronic kidney disease, stage 3 unspecified: Secondary | ICD-10-CM

## 2024-05-11 DIAGNOSIS — Z794 Long term (current) use of insulin: Secondary | ICD-10-CM | POA: Diagnosis not present

## 2024-05-11 DIAGNOSIS — I10 Essential (primary) hypertension: Secondary | ICD-10-CM | POA: Diagnosis not present

## 2024-05-11 DIAGNOSIS — Z7984 Long term (current) use of oral hypoglycemic drugs: Secondary | ICD-10-CM

## 2024-05-11 DIAGNOSIS — I129 Hypertensive chronic kidney disease with stage 1 through stage 4 chronic kidney disease, or unspecified chronic kidney disease: Secondary | ICD-10-CM | POA: Diagnosis not present

## 2024-05-11 DIAGNOSIS — N1832 Chronic kidney disease, stage 3b: Secondary | ICD-10-CM

## 2024-05-11 DIAGNOSIS — Z0000 Encounter for general adult medical examination without abnormal findings: Secondary | ICD-10-CM

## 2024-05-11 LAB — POCT GLYCOSYLATED HEMOGLOBIN (HGB A1C): Hemoglobin A1C: 6.3 % — AB (ref 4.0–5.6)

## 2024-05-11 LAB — GLUCOSE, CAPILLARY: Glucose-Capillary: 237 mg/dL — ABNORMAL HIGH (ref 70–99)

## 2024-05-11 NOTE — Assessment & Plan Note (Signed)
 Stephanie Davis appears to have several outstanding care gaps that remain unaddressed. Despite discussing the importance of completing these, she chose to defer further evaluation at this time. Please consider revisiting these care gaps during her next visit.

## 2024-05-11 NOTE — Progress Notes (Signed)
 CC: Diabetes follow-up  HPI:  [name] is a 73 y.o. female living with a history stated below and presents today for diabetes follow-up. Please see problem based assessment and plan for additional details.  Past Medical History:  Diagnosis Date   CKD (chronic kidney disease), stage III (HCC)    Diabetes mellitus    History of depression    Hyperlipidemia    Hyperparathyroidism    ? h/o hyperCa, no PTH level in chart, uptake study was negative; did not have intervention   Hypertension    Tremor of right hand 11/24/2020    Current Outpatient Medications on File Prior to Visit  Medication Sig Dispense Refill   amLODipine  (NORVASC ) 5 MG tablet Take 1 tablet by mouth once daily 30 tablet 0   atorvastatin  (LIPITOR) 10 MG tablet Take 1 tablet (10 mg total) by mouth daily. 30 tablet 11   Butenafine HCl 1 % cream Apply topically 2 (two) times daily.     chlorthalidone  (HYGROTON ) 50 MG tablet Take 1 tablet by mouth once daily 90 tablet 0   cholecalciferol (VITAMIN D3) 25 MCG (1000 UNIT) tablet Take 1 tablet (1,000 Units total) by mouth daily. 30 tablet 2   glucose blood (ONETOUCH VERIO) test strip Check twice a day with breakfast and at dinner. 200 each 11   JARDIANCE  10 MG TABS tablet TAKE 1 TABLET BY MOUTH ONCE DAILY BEFORE BREAKFAST 30 tablet 0   Lancets (ONETOUCH DELICA PLUS LANCET33G) MISC Check blood sugar 2 times a day 200 each 3   losartan  (COZAAR ) 50 MG tablet Take 1 tablet (50 mg total) by mouth daily. 30 tablet 11   metFORMIN  (GLUCOPHAGE ) 1000 MG tablet Take 1 tablet (1,000 mg total) by mouth daily. 180 tablet 0   mirtazapine  (REMERON ) 7.5 MG tablet TAKE 1 TABLET BY MOUTH AT BEDTIME 30 tablet 0   Multiple Vitamins-Minerals (MULTIVITAL PERFORMANCE PO) Take 1 tablet by mouth daily.     Turmeric (QC TUMERIC COMPLEX PO) Take 1 capsule by mouth daily.     Turmeric 400 MG CAPS Take 1 capsule by mouth daily.     No current facility-administered medications on file prior to  visit.    Family History  Problem Relation Age of Onset   Hypertension Mother    Heart attack Mother    Kidney disease Mother    Heart disease Father 58       myocardial infarction   Hypertension Father    Heart attack Father    Diabetes Sister    Heart disease Sister    Heart disease Brother    Heart disease Brother    Heart disease Brother     Social History   Socioeconomic History   Marital status: Widowed    Spouse name: Not on file   Number of children: 4   Years of education: 12 th grade   Highest education level: Not on file  Occupational History   Occupation: Retired    Associate Professor: [employer]    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 There are 14 steps with handrails on both sides up to the entrance the patient uses.       Patient's method of transportation is personal car.      The highest level of education was high school diploma.      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      Identified important Relationships are Stephanie Davis, church family/friends, good neighbors.       Pets : None       Interests / Fun: Traveling- grandson in [place]  / beach      Current Stressors: Death of Stephanie husband (on his birthday in January 2022)       Religious / Personal Beliefs: I believe in God [employer] all Stephanie life       L. Ducatte, BSN, RN-BC           Social Drivers of  Health   Financial Resource Strain: Low Risk  (08/07/2023)   Overall Financial Resource Strain (CARDIA)    Difficulty of Paying Living Expenses: Not hard at all  Food Insecurity: No Food Insecurity (08/07/2023)   Hunger Vital Sign    Worried About Running Out of Food in the Last Year: Never true    Ran Out of Food in the Last Year: Never true  Transportation Needs: No Transportation Needs (08/07/2023)   PRAPARE - Administrator, Civil Service (Medical): No    Lack of Transportation (Non-Medical): No  Physical Activity: Inactive (08/07/2023)   Exercise Vital Sign    Days of Exercise per Week: 0 days    Minutes of Exercise per Session: 0 min  Stress: No Stress Concern Present (08/07/2023)   [name]    Feeling of Stress : Not at all  Social Connections: Socially Isolated (08/07/2023)   Social Connection and Isolation Panel    Frequency of Communication with Friends and Family: More than three times a week    Frequency of Social Gatherings with Friends and Family: Three times a week    Attends Religious Services: Never    Active Member of Clubs or Organizations: No    Attends Banker Meetings: Never    Marital Status: Widowed  Intimate Partner Violence: Patient Unable To Answer (08/07/2023)   Humiliation, Afraid, Rape, and Kick questionnaire    Fear of Current or Ex-Partner: Patient unable to answer    Emotionally Abused: Patient unable to answer    Physically Abused: Patient unable to answer    Sexually Abused: Patient unable to answer    Review of Systems: ROS negative except for what is noted on the assessment and plan.  Vitals:   05/11/24 0855 05/11/24 0856  BP: (!) 95/58 100/66  Pulse: 60 (!) 59  Temp: 98.2 F (36.8 C)   TempSrc: Oral   SpO2: 97%   Weight: 95 lb 9.6 oz (43.4 kg)   Height: 5' 4 (1.626 m)     Physical Exam: Constitutional: well-appearing woman, sitting in the  wheelchair  Cardiovascular: regular rate and rhythm, no m/r/g Pulmonary/Chest: normal work of breathing on room air Abdominal: soft, non-tender, non-distended Skin: warm and dry Psych:  normal mood and behavior  Assessment & Plan:   Essential hypertension Stephanie Davis presented to the office for routine follow-up regarding her chronic conditions, including hypertension. Her blood pressure today was borderline low at 100/66. Given that she is a 73 year old female, wheelchair-bound, with a BMI of 16, this low reading is concerning. However, she is currently asymptomatic, without changes in appetite or diet.She is currently on amlodipine , losartan , and chlorthalidone  for blood pressure management. In light of today's low reading, I believe it is appropriate to discontinue one antihypertensive medication. We will stop chlorthalidone  at this time and continue amlodipine  5 mg and losartan  50 mg. Ms. Mruk is encouraged to monitor her blood pressure at home, and if hypotension persists, we will consider discontinuing an additional medication. She is also encouraged to increase her oral fluid intake.Follow-up is scheduled in 1 month.  Plan:  - Discontinue chlorthalidone   - Continue amlodipine  5 mg  - Continue losartan  50 mg  Type 2 diabetes mellitus with stage 3 chronic kidney disease (HCC) We reviewed Ms. Summerall's diabetes management during today's visit. Her last hemoglobin A1c, obtained three months ago, was 6.8%. She is currently being treated with metformin  and Jardiance . Although she previously deferred a urine microalbumin test, she agreed to complete it today. She also consented to a foot exam, which was performed. Ms. Clegg denies symptoms of polyuria, polydipsia, or signs of peripheral neuropathy. She is encouraged to maintain adherence to her current medications. Plan: -Recheck hemoglobin A1c -Order urine microalbumin -Continue metformin  -Continue Jardiance  -Order lipid  panel    Healthcare maintenance Ms. Pelaez appears to have several outstanding care gaps that remain unaddressed. Despite discussing the importance of completing these, she chose to defer further evaluation at this time. Please consider revisiting these care gaps during her next visit.       Patient discussed with Dr. Karna Drue Grow, M.D [facility] Phone: [phone] Date 05/11/2024 Time 9:42 AM

## 2024-05-11 NOTE — Assessment & Plan Note (Signed)
 We reviewed Stephanie Davis's diabetes management during today's visit. Her last hemoglobin A1c, obtained three months ago, was 6.8%. She is currently being treated with metformin  and Jardiance . Although she previously deferred a urine microalbumin test, she agreed to complete it today. She also consented to a foot exam, which was performed. Stephanie Davis denies symptoms of polyuria, polydipsia, or signs of peripheral neuropathy. She is encouraged to maintain adherence to her current medications. Plan: -Recheck hemoglobin A1c -Order urine microalbumin -Continue metformin  -Continue Jardiance  -Order lipid panel

## 2024-05-11 NOTE — Assessment & Plan Note (Signed)
 Stephanie Davis presented to the office for routine follow-up regarding her chronic conditions, including hypertension. Her blood pressure today was borderline low at 100/66. Given that she is a 73 year old female, wheelchair-bound, with a BMI of 16, this low reading is concerning. However, she is currently asymptomatic, without changes in appetite or diet.She is currently on amlodipine , losartan , and chlorthalidone  for blood pressure management. In light of today's low reading, I believe it is appropriate to discontinue one antihypertensive medication. We will stop chlorthalidone  at this time and continue amlodipine  5 mg and losartan  50 mg. [name] is encouraged to monitor her blood pressure at home, and if hypotension persists, we will consider discontinuing an additional medication. She is also encouraged to increase her oral fluid intake.Follow-up is scheduled in 1 month.  Plan:  - Discontinue chlorthalidone   - Continue amlodipine  5 mg  - Continue losartan  50 mg

## 2024-05-11 NOTE — Patient Instructions (Addendum)
 Thank you, [name], for allowing us  to be part of your care today. During today's visit, we discussed your blood pressure and blood glucose. I also appreciate your willingness to provide a urine sample.  Since it has been over a year since your last blood work, I will go ahead and order updated labs. I'll contact you once the results are available.  Noting that your blood pressure has been running low, I plan to discontinue one of your medications--chlorthalidone . Please continue taking amlodipine  and losartan  as prescribed. If you notice that your blood pressure becomes too low or too high when checking it at home, don't hesitate to reach out to us .  I will follow up with you again in one month.    I have ordered the following labs for you:  Lab Orders         Basic metabolic panel with GFR         Lipid Profile         Microalbumin / Creatinine Urine Ratio         POC Hbg A1C      Tests ordered today:    Referrals ordered today:   Referral Orders  No referral(s) requested today     I have ordered the following medication/changed the following medications:   Stop the following medications: There are no discontinued medications.   Start the following medications: No orders of the defined types were placed in this encounter.    Follow up: ONE MONTH      Should you have any questions or concerns please call the internal medicine clinic at [phone].   Drue Lisa Grow MD 05/11/2024, 9:18 AM   [facility]

## 2024-05-12 LAB — BASIC METABOLIC PANEL WITH GFR
BUN/Creatinine Ratio: 26 (ref 12–28)
BUN: 61 mg/dL — ABNORMAL HIGH (ref 8–27)
CO2: 22 mmol/L (ref 20–29)
Calcium: 9.1 mg/dL (ref 8.7–10.3)
Chloride: 102 mmol/L (ref 96–106)
Creatinine, Ser: 2.31 mg/dL — ABNORMAL HIGH (ref 0.57–1.00)
Glucose: 231 mg/dL — ABNORMAL HIGH (ref 70–99)
Potassium: 3 mmol/L — ABNORMAL LOW (ref 3.5–5.2)
Sodium: 143 mmol/L (ref 134–144)
eGFR: 22 mL/min/1.73 — ABNORMAL LOW (ref >59)

## 2024-05-12 LAB — LIPID PANEL
Chol/HDL Ratio: 2.1 ratio (ref 0.0–4.4)
Cholesterol, Total: 135 mg/dL (ref 100–199)
HDL: 63 mg/dL (ref >39)
LDL Chol Calc (NIH): 52 mg/dL (ref 0–99)
Triglycerides: 111 mg/dL (ref 0–149)
VLDL Cholesterol Cal: 20 mg/dL (ref 5–40)

## 2024-05-12 LAB — MICROALBUMIN / CREATININE URINE RATIO
Creatinine, Urine: 64.4 mg/dL
Microalb/Creat Ratio: 42 mg/g{creat} — ABNORMAL HIGH (ref 0–29)
Microalbumin, Urine: 27.3 ug/mL

## 2024-05-13 NOTE — Progress Notes (Signed)
 Internal Medicine Clinic Attending  Case discussed with the resident at the time of the visit.  We reviewed the resident's history and exam and pertinent patient test results.  I agree with the assessment, diagnosis, and plan of care documented in the resident's note.

## 2024-05-18 ENCOUNTER — Ambulatory Visit: Payer: Self-pay | Admitting: Student

## 2024-05-19 NOTE — Telephone Encounter (Unsigned)
 Copied from CRM [id number]. Topic: Clinical - Lab/Test Results >> May 19, 2024  9:10 AM Miquel SAILOR wrote: Reason for CRM: Patient returned office call on Lab results from 07/14.Needs call back [phone]

## 2024-05-20 ENCOUNTER — Other Ambulatory Visit

## 2024-05-20 ENCOUNTER — Other Ambulatory Visit: Payer: Self-pay | Admitting: Student

## 2024-05-20 DIAGNOSIS — I10 Essential (primary) hypertension: Secondary | ICD-10-CM

## 2024-05-20 NOTE — Progress Notes (Signed)
 Ms. Artz was seen to review her chronic conditions. Recent BMP showed a rise in serum creatinine from 1.42 to 2.31 and a potassium level of 3.0 mmol/L, which is concerning. To evaluate whether the creatinine elevation is prerenal, potentially related to reduced oral intake, I plan to repeat the BMP. Despite multiple attempts, I have been unable to reach the patient by phone.  If the patient returns the call, please instruct her to come to the clinic promptly for repeat BMP testing. Additionally, advise her to maintain good hydration and nutritional intake.  -Future BMP order placed

## 2024-05-21 LAB — BASIC METABOLIC PANEL WITH GFR
BUN/Creatinine Ratio: 16 (ref 12–28)
BUN: 40 mg/dL — ABNORMAL HIGH (ref 8–27)
Calcium: 9.5 mg/dL (ref 8.7–10.3)
Chloride: 102 mmol/L (ref 96–106)
Creatinine, Ser: 2.53 mg/dL — ABNORMAL HIGH (ref 0.57–1.00)
Glucose: 153 mg/dL — ABNORMAL HIGH (ref 70–99)
Potassium: 3.5 mmol/L (ref 3.5–5.2)
Sodium: 147 mmol/L — ABNORMAL HIGH (ref 134–144)
eGFR: 20 mL/min/1.73 — ABNORMAL LOW (ref >59)

## 2024-05-22 ENCOUNTER — Ambulatory Visit: Payer: Self-pay | Admitting: Student

## 2024-05-25 ENCOUNTER — Other Ambulatory Visit: Payer: Self-pay | Admitting: Student

## 2024-05-25 DIAGNOSIS — I10 Essential (primary) hypertension: Secondary | ICD-10-CM

## 2024-05-25 NOTE — Progress Notes (Signed)
 Repeat BMP showed a steady increase in serum creatinine from 1.42 to 2.31. Her last BMP, obtained one year ago, showed a creatinine of 1.58. She has a history of chronic kidney disease, but it is unclear if this new serum creatinine represents her baseline or a gradual progression of her CKD.  I spoke with Stephanie Davis on the phone to discuss her lab results and expressed concern regarding the uptrending serum creatinine. She was advised to return to the clinic before August 1 for repeat BMP testing. If her serum creatinine continues to rise, we will consider referral to Nephrology for possible renal replacement therapy.  I recommended that she discontinue metformin  due to her estimated GFR being less than 30. The patient appeared to understand and agreed with the plan.

## 2024-05-27 ENCOUNTER — Ambulatory Visit: Admitting: Student

## 2024-05-27 VITALS — BP 115/69 | HR 65 | Temp 98.6°F | Ht 64.0 in

## 2024-05-27 DIAGNOSIS — N289 Disorder of kidney and ureter, unspecified: Secondary | ICD-10-CM | POA: Diagnosis not present

## 2024-05-27 DIAGNOSIS — Z7984 Long term (current) use of oral hypoglycemic drugs: Secondary | ICD-10-CM | POA: Diagnosis not present

## 2024-05-27 DIAGNOSIS — E1122 Type 2 diabetes mellitus with diabetic chronic kidney disease: Secondary | ICD-10-CM | POA: Diagnosis not present

## 2024-05-27 DIAGNOSIS — Z794 Long term (current) use of insulin: Secondary | ICD-10-CM | POA: Diagnosis not present

## 2024-05-27 DIAGNOSIS — N184 Chronic kidney disease, stage 4 (severe): Secondary | ICD-10-CM

## 2024-05-27 DIAGNOSIS — N1832 Chronic kidney disease, stage 3b: Secondary | ICD-10-CM

## 2024-05-27 NOTE — Assessment & Plan Note (Addendum)
 New renal insufficiency of uncertain time course.  Lab Results  Component Value Date   CREATININE 2.53 (H) 05/20/2024   CREATININE 2.31 (H) 05/11/2024   CREATININE 1.42 (H) 02/26/2023   eGFR ~20. Metformin , losartan , and chlorthalidone  discontinued at last visit. Blood pressure remains normal to low. Discontinue amlodipine  today. Repeat BMP. Check UA. Differential includes overtreatment of BP versus progression of diabetes nephropathy although note proteinuria was improved at last visit. High risk of progression to ESRD in next 2-5 years. She would likely decline dialysis as a lifesaving measure. Will request nephrology see her in consult.  Medications Discontinued During This Encounter  Medication Reason   metFORMIN  (GLUCOPHAGE ) 1000 MG tablet    chlorthalidone  (HYGROTON ) 50 MG tablet    amLODipine  (NORVASC ) 5 MG tablet    losartan  (COZAAR ) 50 MG tablet

## 2024-05-27 NOTE — Assessment & Plan Note (Signed)
 Chronic and stable. Discontinuing metformin  due to decreased GFR. Continue jardiance  10 mg daily.

## 2024-05-27 NOTE — Progress Notes (Signed)
 Patient name: Stephanie Davis Date of birth: [birth year] Date of visit: 05/27/24  Subjective   Chief concern: follow up for kidney problem  She's here to follow-up after recent testing showed decline in renal function. Chlorthalidone  and losartan  were stopped. Metformin  was stopped.  Review of Systems  Constitutional:  Positive for weight loss (ongoing weight loss over last few years).  Respiratory:  Negative for shortness of breath.   Cardiovascular:  Negative for leg swelling.  Genitourinary:        No changes to urinary habits, specifically no decreased urinary frequency    Current Outpatient Medications  Medication Instructions   atorvastatin  (LIPITOR) 10 mg, Oral, Daily   Butenafine HCl 1 % cream Topical, 2 times daily,     cholecalciferol (VITAMIN D3) 1,000 Units, Oral, Daily   glucose blood (ONETOUCH VERIO) test strip Check twice a day with breakfast and at dinner.   Jardiance  10 mg, Oral, Daily before breakfast   Lancets (ONETOUCH DELICA PLUS LANCET33G) MISC Check blood sugar 2 times a day   mirtazapine  (REMERON ) 7.5 mg, Oral, Daily at bedtime   Multiple Vitamins-Minerals (MULTIVITAL PERFORMANCE PO) 1 tablet, Oral, Daily,     Turmeric (QC TUMERIC COMPLEX PO) 1 capsule, Oral, Daily   Turmeric 400 MG CAPS 1 capsule, Oral, Daily     Objective  Today's Vitals   05/27/24 1445  BP: 115/69  Pulse: 65  Temp: 98.6 F (37 C)  TempSrc: Oral  SpO2: 98%  Height: 5' 4 (1.626 m)  Body mass index is 16.41 kg/m.   Physical Exam Constitutional:      Comments: Frail-appearing  Cardiovascular:     Rate and Rhythm: Normal rate and regular rhythm.  Pulmonary:     Effort: Pulmonary effort is normal. No respiratory distress.  Skin:    General: Skin is warm and dry.  Neurological:     Mental Status: She is alert.     Cranial Nerves: No facial asymmetry.  Psychiatric:        Mood and Affect: Affect normal.        Speech: Speech normal.        Behavior: Behavior normal.       Assessment & Plan   Renal insufficiency Assessment & Plan: New renal insufficiency of uncertain time course.  Lab Results  Component Value Date   CREATININE 2.53 (H) 05/20/2024   CREATININE 2.31 (H) 05/11/2024   CREATININE 1.42 (H) 02/26/2023   eGFR ~20. Metformin , losartan , and chlorthalidone  discontinued at last visit. Blood pressure remains normal to low. Discontinue amlodipine  today. Repeat BMP. Check UA. Differential includes overtreatment of BP versus progression of diabetes nephropathy although note proteinuria was improved at last visit. High risk of progression to ESRD in next 2-5 years. She would likely decline dialysis as a lifesaving measure. Will request nephrology see her in consult.  Medications Discontinued During This Encounter  Medication Reason   metFORMIN  (GLUCOPHAGE ) 1000 MG tablet    chlorthalidone  (HYGROTON ) 50 MG tablet    amLODipine  (NORVASC ) 5 MG tablet    losartan  (COZAAR ) 50 MG tablet      Orders: -     Urinalysis, Routine w reflex microscopic -     Ambulatory referral to Nephrology -     Basic metabolic panel with GFR  Type 2 diabetes mellitus with stage 4 chronic kidney disease, without long-term current use of insulin  (HCC) Assessment & Plan: Chronic and stable. Discontinuing metformin  due to decreased GFR. Continue jardiance  10 mg daily.  Orders: -  Basic metabolic panel with GFR    Return in 3 weeks (on 06/17/2024) for kidney disease and advance care planning.  Ozell Kung MD 05/27/2024, 5:53 PM

## 2024-05-27 NOTE — Patient Instructions (Addendum)
 Return in 3 weeks (on 06/17/2024) for kidney disease.  Stop the amlodipine , chlorthalidone , losartan , and metformin .  Remember to bring all of the medications that you take (including over the counter medications and supplements) with you to every clinic visit.  This after visit summary is an important review of tests, referrals, and medication changes that were discussed during your visit. If you have questions or concerns, call [phone]. Outside of clinic business hours, call the main hospital at [phone] and ask the operator for the on-call internal medicine resident.   Ozell Kung MD 05/27/2024, 3:59 PM

## 2024-05-28 ENCOUNTER — Ambulatory Visit: Payer: Self-pay | Admitting: Student

## 2024-05-28 LAB — URINALYSIS, ROUTINE W REFLEX MICROSCOPIC
Bilirubin, UA: NEGATIVE
Ketones, UA: NEGATIVE
Leukocytes,UA: NEGATIVE
Nitrite, UA: NEGATIVE
RBC, UA: NEGATIVE
Specific Gravity, UA: 1.011 (ref 1.005–1.030)
Urobilinogen, Ur: 0.2 mg/dL (ref 0.2–1.0)
pH, UA: 5.5 (ref 5.0–7.5)

## 2024-05-28 LAB — BASIC METABOLIC PANEL WITH GFR
BUN/Creatinine Ratio: 16 (ref 12–28)
BUN: 29 mg/dL — ABNORMAL HIGH (ref 8–27)
CO2: 23 mmol/L (ref 20–29)
Calcium: 9.4 mg/dL (ref 8.7–10.3)
Chloride: 100 mmol/L (ref 96–106)
Creatinine, Ser: 1.76 mg/dL — ABNORMAL HIGH (ref 0.57–1.00)
Glucose: 118 mg/dL — ABNORMAL HIGH (ref 70–99)
Potassium: 3.3 mmol/L — ABNORMAL LOW (ref 3.5–5.2)
Sodium: 142 mmol/L (ref 134–144)
eGFR: 31 mL/min/1.73 — ABNORMAL LOW (ref >59)

## 2024-05-28 LAB — MICROSCOPIC EXAMINATION
Bacteria, UA: NONE SEEN
Casts: NONE SEEN /LPF
Epithelial Cells (non renal): NONE SEEN /HPF (ref 0–10)
RBC, Urine: NONE SEEN /HPF (ref 0–2)

## 2024-05-28 NOTE — Telephone Encounter (Signed)
 Lab Results  Component Value Date   CREATININE 1.76 (H) 05/27/2024   CREATININE 2.53 (H) 05/20/2024   CREATININE 2.31 (H) 05/11/2024   Improving creatinine. UA bland. Likely pre-renal disease that's improving since discontinuation of BP medicine. 2 and 5 year risk of progression to kidney failure requiring dialysis is much lower with these numbers. Referral to nephrology is still reasonable if the patient desires it.  Ozell Kung MD 05/28/2024, 10:31 AM

## 2024-05-28 NOTE — Progress Notes (Signed)
 Internal Medicine Clinic Attending  Case discussed with the resident at the time of the visit.  We reviewed the resident's history and exam and pertinent patient test results.  I agree with the assessment, diagnosis, and plan of care documented in the resident's note.

## 2024-05-29 ENCOUNTER — Other Ambulatory Visit: Payer: Self-pay | Admitting: Student

## 2024-05-29 DIAGNOSIS — Z794 Long term (current) use of insulin: Secondary | ICD-10-CM

## 2024-05-29 MED ORDER — EMPAGLIFLOZIN 10 MG PO TABS: 10.0000 mg | ORAL_TABLET | Freq: Every day | ORAL | 2 refills | Status: DC

## 2024-05-29 NOTE — Telephone Encounter (Unsigned)
 Copied from CRM [id number]. Topic: Clinical - Medication Refill >> May 29, 2024 11:34 AM Miquel SAILOR wrote: Medication: JARDIANCE  10 MG TABS tablet-App was on 07/30 she requested this that day Needs ASAP   Has the patient contacted their pharmacy? Yes (Agent: If no, request that the patient contact the pharmacy for the refill. If patient does not wish to contact the pharmacy document the reason why and proceed with request.) (Agent: If yes, when and what did the pharmacy advise?)  This is the patient's preferred pharmacy:  [id number] - [place] ([place] - [address] [place] ([place]) [place] [address] Phone: [phone] Fax: [phone]  Is this the correct pharmacy for this prescription? Yes If no, delete pharmacy and type the correct one.   Has the prescription been filled recently? Yes  Is the patient out of the medication? No 2 pills   Has the patient been seen for an appointment in the last year OR does the patient have an upcoming appointment? Yes  Can we respond through MyChart? Yes  Agent: Please be advised that Rx refills may take up to 3 business days. We ask that you follow-up with your pharmacy.

## 2024-06-17 ENCOUNTER — Ambulatory Visit: Admitting: Student

## 2024-06-17 VITALS — BP 134/69 | HR 71 | Temp 98.2°F | Ht 63.0 in | Wt 100.8 lb

## 2024-06-17 DIAGNOSIS — E785 Hyperlipidemia, unspecified: Secondary | ICD-10-CM | POA: Diagnosis not present

## 2024-06-17 DIAGNOSIS — I10 Essential (primary) hypertension: Secondary | ICD-10-CM

## 2024-06-17 DIAGNOSIS — N183 Chronic kidney disease, stage 3 unspecified: Secondary | ICD-10-CM

## 2024-06-17 DIAGNOSIS — I129 Hypertensive chronic kidney disease with stage 1 through stage 4 chronic kidney disease, or unspecified chronic kidney disease: Secondary | ICD-10-CM | POA: Diagnosis not present

## 2024-06-17 DIAGNOSIS — Z8659 Personal history of other mental and behavioral disorders: Secondary | ICD-10-CM

## 2024-06-17 DIAGNOSIS — Z1211 Encounter for screening for malignant neoplasm of colon: Secondary | ICD-10-CM

## 2024-06-17 DIAGNOSIS — Z13820 Encounter for screening for osteoporosis: Secondary | ICD-10-CM

## 2024-06-17 DIAGNOSIS — Z1231 Encounter for screening mammogram for malignant neoplasm of breast: Secondary | ICD-10-CM

## 2024-06-17 DIAGNOSIS — N1832 Chronic kidney disease, stage 3b: Secondary | ICD-10-CM

## 2024-06-17 DIAGNOSIS — Z0000 Encounter for general adult medical examination without abnormal findings: Secondary | ICD-10-CM

## 2024-06-17 MED ORDER — ATORVASTATIN CALCIUM 10 MG PO TABS: 10.0000 mg | ORAL_TABLET | Freq: Every day | ORAL | 3 refills | Status: AC

## 2024-06-17 MED ORDER — MIRTAZAPINE 7.5 MG PO TABS: 7.5000 mg | ORAL_TABLET | Freq: Every day | ORAL | 3 refills | Status: AC

## 2024-06-17 MED ORDER — LOSARTAN POTASSIUM 25 MG PO TABS: 25.0000 mg | ORAL_TABLET | Freq: Every day | ORAL | 1 refills | Status: DC

## 2024-06-17 NOTE — Progress Notes (Unsigned)
 CC: Follow-up  HPI:  Stephanie Davis is a 73 y.o. female living with a history stated below and presents today for follow-up. Please see problem based assessment and plan for additional details.  Past Medical History:  Diagnosis Date   CKD (chronic kidney disease), stage III (HCC)    Diabetes mellitus    History of depression    Hyperlipidemia    Hyperparathyroidism    ? h/o hyperCa, no PTH level in chart, uptake study was negative; did not have intervention   Hypertension    Tremor of right hand 11/24/2020    Current Outpatient Medications on File Prior to Visit  Medication Sig Dispense Refill   atorvastatin  (LIPITOR) 10 MG tablet Take 1 tablet (10 mg total) by mouth daily. 30 tablet 11   Butenafine HCl 1 % cream Apply topically 2 (two) times daily.     cholecalciferol (VITAMIN D3) 25 MCG (1000 UNIT) tablet Take 1 tablet (1,000 Units total) by mouth daily. 30 tablet 2   empagliflozin  (JARDIANCE ) 10 MG TABS tablet Take 1 tablet (10 mg total) by mouth daily before breakfast. 30 tablet 2   glucose blood (ONETOUCH VERIO) test strip Check twice a day with breakfast and at dinner. 200 each 11   Lancets (ONETOUCH DELICA PLUS LANCET33G) MISC Check blood sugar 2 times a day 200 each 3   mirtazapine  (REMERON ) 7.5 MG tablet TAKE 1 TABLET BY MOUTH AT BEDTIME 30 tablet 0   Multiple Vitamins-Minerals (MULTIVITAL PERFORMANCE PO) Take 1 tablet by mouth daily.     Turmeric (QC TUMERIC COMPLEX PO) Take 1 capsule by mouth daily.     Turmeric 400 MG CAPS Take 1 capsule by mouth daily.     No current facility-administered medications on file prior to visit.    Family History  Problem Relation Age of Onset   Hypertension Mother    Heart attack Mother    Kidney disease Mother    Heart disease Father 69       myocardial infarction   Hypertension Father    Heart attack Father    Diabetes Sister    Heart disease Sister    Heart disease Brother    Heart disease Brother    Heart disease  Brother     Social History   Socioeconomic History   Marital status: Widowed    Spouse name: Not on file   Number of children: 4   Years of education: 12 th grade   Highest education level: Not on file  Occupational History   Occupation: Retired    Associate Professor: [employer]    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 There are 14 steps with handrails on both sides up to the entrance the patient uses.       Patient's method of transportation is personal  car.      The highest level of education was high school diploma.      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      Identified important Relationships are My daughters, my Juliene, church family/friends, good neighbors.       Pets : None       Interests / Fun: Traveling- grandson in [place]  / beach      Current Stressors: Death of my husband (on his birthday in January 2022)       Religious / Personal Beliefs: I believe in God [employer] all my life       L. Ducatte, BSN, RN-BC           Social Drivers of Health   Financial Resource Strain: Low Risk  (05/27/2024)   Overall Financial Resource Strain (CARDIA)    Difficulty of Paying Living Expenses: Not hard at all  Food Insecurity: No Food Insecurity (05/27/2024)   Hunger Vital Sign    Worried About Running Out of Food in the Last Year: Never true    Ran Out of Food in the Last Year: Never true   Transportation Needs: No Transportation Needs (05/27/2024)   PRAPARE - Administrator, Civil Service (Medical): No    Lack of Transportation (Non-Medical): No  Physical Activity: Inactive (05/27/2024)   Exercise Vital Sign    Days of Exercise per Week: 0 days    Minutes of Exercise per Session: 0 min  Stress: Stress Concern Present (05/27/2024)   [name]    Feeling of Stress: To some extent  Social Connections: Socially Isolated (05/27/2024)   Social Connection and Isolation Panel    Frequency of Communication with Friends and Family: More than three times a week    Frequency of Social Gatherings with Friends and Family: Three times a week    Attends Religious Services: Never    Active Member of Clubs or Organizations: No    Attends Banker Meetings: Never    Marital Status: Widowed  Intimate Partner Violence: Not At Risk (05/27/2024)   Humiliation, Afraid, Rape, and Kick questionnaire    Fear of Current or Ex-Partner: No    Emotionally Abused: No    Physically Abused: No    Sexually Abused: No    Review of Systems: ROS negative except for what is noted on the assessment and plan.  Vitals:   06/17/24 0813  BP: 134/69  Pulse: 71  Temp: 98.2 F (36.8 C)  TempSrc: Oral  SpO2: 100%  Weight: 100 lb 12.8 oz (45.7 kg)  Height: 5' 3 (1.6 m)    Physical Exam: Constitutional: well-appearing, sitting in chair, in no acute distress Cardiovascular: regular rate and rhythm, no m/r/g Pulmonary/Chest: normal work of breathing on room air, lungs clear to auscultation bilaterally Abdominal: soft, non-tender, non-distended MSK: normal bulk and tone Skin: warm and dry Psych: normal mood and behavior  Assessment & Plan:     Patient discussed with Dr. Karna  eGFR ~20. Metformin , losartan , and chlorthalidone  discontinued at last visit. Blood pressure remains normal to low. Discontinue amlodipine   today. Repeat BMP. Check UA. Differential includes overtreatment of BP versus progression of diabetes nephropathy although note proteinuria was improved at last visit. High risk of progression to ESRD in next 2-5 years. She would likely decline dialysis as a lifesaving measure. Will request nephrology see her in consult.  No problem-specific Assessment & Plan notes found for this encounter.   Norman Lobstein, D.O. [facility] Internal Medicine, PGY-2 Phone: [phone] Date 06/17/2024 Time 8:20 AM

## 2024-06-18 DIAGNOSIS — N1832 Chronic kidney disease, stage 3b: Secondary | ICD-10-CM | POA: Insufficient documentation

## 2024-06-18 NOTE — Assessment & Plan Note (Signed)
 Refilled Mirtazapine  which she also uses for reduced appetite.

## 2024-06-18 NOTE — Assessment & Plan Note (Signed)
 She has been borderline 3B/4 but recent BMP showed improvement in both GFR and creatinine (now 31 and 1.76 respectively).  To further improve kidney function/proteinuria, will reinitiate losartan  at lower dose per above.  She also takes Jardiance  10 mg daily.  Will check BMP at follow-up.  Nephrology referral has been sent per last visit.

## 2024-06-18 NOTE — Assessment & Plan Note (Addendum)
 DEXA, screening colonoscopy, mammogram sent today.

## 2024-06-18 NOTE — Assessment & Plan Note (Addendum)
 BP is 134/69.  Antihypertensives were discontinued at recent visits due to low-normal in-office readings.  She does not check BP at home.  Given recent BMP with hypokalemia, and proteinuria on microalbumin:creatinine, will start losartan  25 mg daily today.  She was previously on 50 mg daily.  She will obtain BP cuff and record ambulatory readings to bring at follow-up. Plan to re-check BMP at follow-up next month.

## 2024-06-18 NOTE — Addendum Note (Signed)
 Addended by: KARNA FELLOWS on: 06/18/2024 11:13 AM   Modules accepted: Level of Service

## 2024-06-18 NOTE — Progress Notes (Signed)
 Internal Medicine Clinic Attending  Case discussed with the resident at the time of the visit.  We reviewed the resident's history and exam and pertinent patient test results.  I agree with the assessment, diagnosis, and plan of care documented in the resident's note.  Anti-hypertensive medications also held in the setting of recent change in kidney function. There has been improvement with discontinuation of chlorthalidone  and losartan  50 mg. 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

## 2024-06-25 ENCOUNTER — Telehealth: Payer: Self-pay | Admitting: *Deleted

## 2024-06-25 NOTE — Telephone Encounter (Signed)
 Mammogram appointment September 30,2025 @ 10:20 to arrive 10:00 am / appointment mailed to the patient.

## 2024-07-14 ENCOUNTER — Ambulatory Visit

## 2024-07-14 DIAGNOSIS — E785 Hyperlipidemia, unspecified: Secondary | ICD-10-CM | POA: Diagnosis not present

## 2024-07-14 DIAGNOSIS — I129 Hypertensive chronic kidney disease with stage 1 through stage 4 chronic kidney disease, or unspecified chronic kidney disease: Secondary | ICD-10-CM | POA: Diagnosis not present

## 2024-07-14 DIAGNOSIS — N1832 Chronic kidney disease, stage 3b: Secondary | ICD-10-CM | POA: Diagnosis not present

## 2024-07-14 DIAGNOSIS — E559 Vitamin D deficiency, unspecified: Secondary | ICD-10-CM | POA: Diagnosis not present

## 2024-07-14 DIAGNOSIS — E1122 Type 2 diabetes mellitus with diabetic chronic kidney disease: Secondary | ICD-10-CM | POA: Diagnosis not present

## 2024-07-28 ENCOUNTER — Ambulatory Visit

## 2024-08-12 ENCOUNTER — Ambulatory Visit: Payer: PPO

## 2024-08-14 ENCOUNTER — Other Ambulatory Visit: Payer: Self-pay | Admitting: Student

## 2024-08-14 MED ORDER — LOSARTAN POTASSIUM 25 MG PO TABS: 25.0000 mg | ORAL_TABLET | Freq: Every day | ORAL | 0 refills | Status: DC

## 2024-08-14 NOTE — Telephone Encounter (Unsigned)
 Copied from CRM [id number]. Topic: Clinical - Medication Refill >> Aug 14, 2024 10:00 AM Debby BROCKS wrote: Medication:  losartan  (COZAAR ) 25 MG tablet  Has the patient contacted their pharmacy? Yes (Agent: If no, request that the patient contact the pharmacy for the refill. If patient does not wish to contact the pharmacy document the reason why and proceed with request.) (Agent: If yes, when and what did the pharmacy advise?)  This is the patient's preferred pharmacy:  [id number] - [place] ([place]), [place] - [address] [place] ([place]) [place] [address] Phone: [phone] Fax: [phone]  Is this the correct pharmacy for this prescription? Yes If no, delete pharmacy and type the correct one.   Has the prescription been filled recently? No  Is the patient out of the medication? No, 2 pills left  Has the patient been seen for an appointment in the last year OR does the patient have an upcoming appointment? Yes  Can we respond through MyChart? Yes  Agent: Please be advised that Rx refills may take up to 3 business days. We ask that you follow-up with your pharmacy.

## 2024-08-17 ENCOUNTER — Ambulatory Visit: Admitting: Student

## 2024-08-18 ENCOUNTER — Telehealth: Payer: Self-pay | Admitting: *Deleted

## 2024-08-18 NOTE — Telephone Encounter (Signed)
 Mammogram appointment 07/28/2024 @ 10:20  /  canceled

## 2024-08-27 ENCOUNTER — Other Ambulatory Visit: Payer: Self-pay | Admitting: Student

## 2024-08-27 DIAGNOSIS — Z794 Long term (current) use of insulin: Secondary | ICD-10-CM

## 2024-08-27 MED ORDER — EMPAGLIFLOZIN 10 MG PO TABS: 10.0000 mg | ORAL_TABLET | Freq: Every day | ORAL | 2 refills | Status: DC

## 2024-08-27 NOTE — Telephone Encounter (Unsigned)
 Copied from CRM #[id number]. Topic: Clinical - Medication Refill >> Aug 27, 2024  9:54 AM Alfonso ORN wrote: Medication: empagliflozin  (JARDIANCE ) 10 MG TABS tablet  Has the patient contacted their pharmacy? Yes (Agent: If no, request that the patient contact the pharmacy for the refill. If patient does not wish to contact the pharmacy document the reason why and proceed with request.) (Agent: If yes, when and what did the pharmacy advise?)  This is the patient's preferred pharmacy:  [id number] - [place] ([place]), [place] - [address] [place] ([place]) [place] [address] Phone: [phone] Fax: [phone]  Is this the correct pharmacy for this prescription? Yes If no, delete pharmacy and type the correct one.   Has the prescription been filled recently? Yes  Is the patient out of the medication? No  pt. Has 3 left   Has the patient been seen for an appointment in the last year OR does the patient have an upcoming appointment? Yes  Can we respond through MyChart? No  Agent: Please be advised that Rx refills may take up to 3 business days. We ask that you follow-up with your pharmacy.

## 2024-09-09 ENCOUNTER — Other Ambulatory Visit: Payer: Self-pay | Admitting: Student

## 2024-11-11 ENCOUNTER — Other Ambulatory Visit: Payer: Self-pay | Admitting: Student

## 2024-11-19 ENCOUNTER — Other Ambulatory Visit: Payer: Self-pay | Admitting: Student

## 2024-11-19 DIAGNOSIS — N1832 Chronic kidney disease, stage 3b: Secondary | ICD-10-CM
